# Patient Record
Sex: Female | Born: 1965 | Race: White | Hispanic: No | Marital: Married | Smoking: Never smoker
Health system: Southern US, Community
[De-identification: ages and names within clinical notes are randomized; demographics above are authoritative.]

---

## 2020-05-05 ENCOUNTER — Other Ambulatory Visit: Payer: Self-pay

## 2020-05-05 ENCOUNTER — Encounter (HOSPITAL_COMMUNITY): Payer: Self-pay | Admitting: Emergency Medicine

## 2020-05-05 ENCOUNTER — Inpatient Hospital Stay (HOSPITAL_COMMUNITY)
Admission: EM | Admit: 2020-05-05 | Discharge: 2020-05-11 | DRG: 758 | Disposition: A | Discharge status: Home / Self Care | Payer: 59 | Attending: Obstetrics and Gynecology | Admitting: Obstetrics and Gynecology

## 2020-05-05 DIAGNOSIS — R1032 Left lower quadrant pain: Secondary | ICD-10-CM | POA: Diagnosis not present

## 2020-05-05 DIAGNOSIS — N739 Female pelvic inflammatory disease, unspecified: Principal | ICD-10-CM

## 2020-05-05 DIAGNOSIS — R102 Pelvic and perineal pain: Secondary | ICD-10-CM

## 2020-05-05 DIAGNOSIS — N7093 Salpingitis and oophoritis, unspecified: Secondary | ICD-10-CM | POA: Diagnosis present

## 2020-05-05 DIAGNOSIS — Z20822 Contact with and (suspected) exposure to covid-19: Secondary | ICD-10-CM | POA: Diagnosis present

## 2020-05-05 DIAGNOSIS — K5792 Diverticulitis of intestine, part unspecified, without perforation or abscess without bleeding: Secondary | ICD-10-CM | POA: Diagnosis present

## 2020-05-05 DIAGNOSIS — K567 Ileus, unspecified: Secondary | ICD-10-CM | POA: Diagnosis present

## 2020-05-05 DIAGNOSIS — N838 Other noninflammatory disorders of ovary, fallopian tube and broad ligament: Secondary | ICD-10-CM | POA: Diagnosis present

## 2020-05-05 DIAGNOSIS — N839 Noninflammatory disorder of ovary, fallopian tube and broad ligament, unspecified: Secondary | ICD-10-CM | POA: Diagnosis present

## 2020-05-05 LAB — COMPREHENSIVE METABOLIC PANEL
ALT: 25 U/L (ref 0–44)
AST: 21 U/L (ref 15–41)
Albumin: 3.7 g/dL (ref 3.5–5.0)
Alkaline Phosphatase: 62 U/L (ref 38–126)
Anion gap: 10 (ref 5–15)
BUN: 8 mg/dL (ref 6–20)
CO2: 27 mmol/L (ref 22–32)
Calcium: 9.2 mg/dL (ref 8.9–10.3)
Chloride: 101 mmol/L (ref 98–111)
Creatinine, Ser: 0.99 mg/dL (ref 0.44–1.00)
GFR, Estimated: >60 mL/min (ref >60)
Glucose, Bld: 126 mg/dL — ABNORMAL HIGH (ref 70–99)
Potassium: 4.4 mmol/L (ref 3.5–5.1)
Sodium: 138 mmol/L (ref 135–145)
Total Bilirubin: 0.5 mg/dL (ref 0.3–1.2)
Total Protein: 6.8 g/dL (ref 6.5–8.1)

## 2020-05-05 LAB — I-STAT BETA HCG BLOOD, ED (MC, WL, AP ONLY): I-stat hCG, quantitative: <5 m[IU]/mL (ref <5)

## 2020-05-05 LAB — URINALYSIS, ROUTINE W REFLEX MICROSCOPIC
Bacteria, UA: NONE SEEN
Bilirubin Urine: NEGATIVE
Glucose, UA: NEGATIVE mg/dL
Ketones, ur: NEGATIVE mg/dL
Nitrite: NEGATIVE
Protein, ur: NEGATIVE mg/dL
Specific Gravity, Urine: 1.023 (ref 1.005–1.030)
pH: 5 (ref 5.0–8.0)

## 2020-05-05 LAB — LIPASE, BLOOD: Lipase: 33 U/L (ref 11–51)

## 2020-05-05 LAB — CBC
HCT: 40.5 % (ref 36.0–46.0)
Hemoglobin: 13.5 g/dL (ref 12.0–15.0)
MCH: 30.4 pg (ref 26.0–34.0)
MCHC: 33.3 g/dL (ref 30.0–36.0)
MCV: 91.2 fL (ref 80.0–100.0)
Platelets: 273 10*3/uL (ref 150–400)
RBC: 4.44 MIL/uL (ref 3.87–5.11)
RDW: 13.1 % (ref 11.5–15.5)
WBC: 17.9 10*3/uL — ABNORMAL HIGH (ref 4.0–10.5)
nRBC: 0 % (ref 0.0–0.2)

## 2020-05-05 MED ORDER — ACETAMINOPHEN 325 MG PO TABS
650.0000 mg | ORAL_TABLET | Freq: Once | ORAL | Status: AC
  Administered 2020-05-05: 650 mg via ORAL
  Filled 2020-05-05: qty 2

## 2020-05-05 NOTE — ED Triage Notes (Signed)
Pt reports left lower abdominal pain since last night. Pt went to urgent care and was given prescriptions and told to come to the ER if worse. Pt reports after taking medicines, vomited and pain worsened.

## 2020-05-06 ENCOUNTER — Emergency Department (HOSPITAL_COMMUNITY): Payer: 59

## 2020-05-06 ENCOUNTER — Encounter (HOSPITAL_COMMUNITY): Payer: Self-pay | Admitting: Family Medicine

## 2020-05-06 ENCOUNTER — Other Ambulatory Visit: Payer: Self-pay

## 2020-05-06 ENCOUNTER — Inpatient Hospital Stay (HOSPITAL_COMMUNITY): Payer: 59

## 2020-05-06 DIAGNOSIS — K5792 Diverticulitis of intestine, part unspecified, without perforation or abscess without bleeding: Secondary | ICD-10-CM | POA: Diagnosis present

## 2020-05-06 DIAGNOSIS — N739 Female pelvic inflammatory disease, unspecified: Secondary | ICD-10-CM | POA: Diagnosis not present

## 2020-05-06 DIAGNOSIS — Z20822 Contact with and (suspected) exposure to covid-19: Secondary | ICD-10-CM | POA: Diagnosis present

## 2020-05-06 DIAGNOSIS — N7093 Salpingitis and oophoritis, unspecified: Secondary | ICD-10-CM | POA: Diagnosis not present

## 2020-05-06 DIAGNOSIS — N838 Other noninflammatory disorders of ovary, fallopian tube and broad ligament: Secondary | ICD-10-CM | POA: Diagnosis not present

## 2020-05-06 DIAGNOSIS — N858 Other specified noninflammatory disorders of uterus: Secondary | ICD-10-CM | POA: Diagnosis not present

## 2020-05-06 DIAGNOSIS — K567 Ileus, unspecified: Secondary | ICD-10-CM | POA: Diagnosis present

## 2020-05-06 DIAGNOSIS — R1032 Left lower quadrant pain: Secondary | ICD-10-CM | POA: Diagnosis present

## 2020-05-06 DIAGNOSIS — N839 Noninflammatory disorder of ovary, fallopian tube and broad ligament, unspecified: Secondary | ICD-10-CM | POA: Diagnosis present

## 2020-05-06 LAB — CBC WITH DIFFERENTIAL/PLATELET
Abs Immature Granulocytes: 0.08 10*3/uL — ABNORMAL HIGH (ref 0.00–0.07)
Basophils Absolute: 0 10*3/uL (ref 0.0–0.1)
Basophils Relative: 0 %
Eosinophils Absolute: 0 10*3/uL (ref 0.0–0.5)
Eosinophils Relative: 0 %
HCT: 34.5 % — ABNORMAL LOW (ref 36.0–46.0)
Hemoglobin: 11.8 g/dL — ABNORMAL LOW (ref 12.0–15.0)
Immature Granulocytes: 1 %
Lymphocytes Relative: 10 %
Lymphs Abs: 1.6 10*3/uL (ref 0.7–4.0)
MCH: 30.6 pg (ref 26.0–34.0)
MCHC: 34.2 g/dL (ref 30.0–36.0)
MCV: 89.6 fL (ref 80.0–100.0)
Monocytes Absolute: 0.9 10*3/uL (ref 0.1–1.0)
Monocytes Relative: 6 %
Neutro Abs: 13.9 10*3/uL — ABNORMAL HIGH (ref 1.7–7.7)
Neutrophils Relative %: 83 %
Platelets: 220 10*3/uL (ref 150–400)
RBC: 3.85 MIL/uL — ABNORMAL LOW (ref 3.87–5.11)
RDW: 13.2 % (ref 11.5–15.5)
WBC: 16.5 10*3/uL — ABNORMAL HIGH (ref 4.0–10.5)
nRBC: 0 % (ref 0.0–0.2)

## 2020-05-06 LAB — COMPREHENSIVE METABOLIC PANEL
ALT: 21 U/L (ref 0–44)
AST: 17 U/L (ref 15–41)
Albumin: 3 g/dL — ABNORMAL LOW (ref 3.5–5.0)
Alkaline Phosphatase: 48 U/L (ref 38–126)
Anion gap: 8 (ref 5–15)
BUN: <5 mg/dL — ABNORMAL LOW (ref 6–20)
CO2: 26 mmol/L (ref 22–32)
Calcium: 8.5 mg/dL — ABNORMAL LOW (ref 8.9–10.3)
Chloride: 105 mmol/L (ref 98–111)
Creatinine, Ser: 0.76 mg/dL (ref 0.44–1.00)
GFR, Estimated: >60 mL/min (ref >60)
Glucose, Bld: 106 mg/dL — ABNORMAL HIGH (ref 70–99)
Potassium: 3.7 mmol/L (ref 3.5–5.1)
Sodium: 139 mmol/L (ref 135–145)
Total Bilirubin: 0.8 mg/dL (ref 0.3–1.2)
Total Protein: 5.7 g/dL — ABNORMAL LOW (ref 6.5–8.1)

## 2020-05-06 LAB — TYPE AND SCREEN
ABO/RH(D): A NEG
Antibody Screen: NEGATIVE

## 2020-05-06 LAB — RESP PANEL BY RT-PCR (FLU A&B, COVID) ARPGX2
Influenza A by PCR: NEGATIVE
Influenza B by PCR: NEGATIVE
SARS Coronavirus 2 by RT PCR: NEGATIVE

## 2020-05-06 LAB — LACTIC ACID, PLASMA
Lactic Acid, Venous: 1.2 mmol/L (ref 0.5–1.9)
Lactic Acid, Venous: 1 mmol/L (ref 0.5–1.9)
Lactic Acid, Venous: 1 mmol/L (ref 0.5–1.9)

## 2020-05-06 LAB — MAGNESIUM: Magnesium: 1.6 mg/dL — ABNORMAL LOW (ref 1.7–2.4)

## 2020-05-06 MED ORDER — ENOXAPARIN SODIUM 60 MG/0.6ML ~~LOC~~ SOLN: 60.0000 mg | SUBCUTANEOUS | Status: DC

## 2020-05-06 MED ORDER — LACTATED RINGERS IV BOLUS (SEPSIS)
1000.0000 mL | Freq: Once | INTRAVENOUS | Status: AC
  Administered 2020-05-06: 1000 mL via INTRAVENOUS

## 2020-05-06 MED ORDER — METRONIDAZOLE IN NACL 5-0.79 MG/ML-% IV SOLN
500.0000 mg | Freq: Once | INTRAVENOUS | Status: AC
  Administered 2020-05-06: 500 mg via INTRAVENOUS
  Filled 2020-05-06: qty 100

## 2020-05-06 MED ORDER — LACTATED RINGERS IV SOLN: INTRAVENOUS | Status: DC

## 2020-05-06 MED ORDER — MENTHOL 3 MG MT LOZG
1.0000 | LOZENGE | OROMUCOSAL | Status: DC | PRN
Start: 2020-05-06 — End: 2020-05-11

## 2020-05-06 MED ORDER — FENTANYL CITRATE (PF) 100 MCG/2ML IJ SOLN
100.0000 ug | Freq: Once | INTRAMUSCULAR | Status: AC
  Administered 2020-05-06: 100 ug via INTRAVENOUS
  Filled 2020-05-06: qty 2

## 2020-05-06 MED ORDER — MAGNESIUM SULFATE 2 GM/50ML IV SOLN
2.0000 g | Freq: Once | INTRAVENOUS | Status: AC
  Administered 2020-05-06: 2 g via INTRAVENOUS
  Filled 2020-05-06: qty 50

## 2020-05-06 MED ORDER — POLYETHYLENE GLYCOL 3350 17 G PO PACK: 17.0000 g | PACK | Freq: Every day | ORAL | Status: DC | PRN

## 2020-05-06 MED ORDER — IOHEXOL 9 MG/ML PO SOLN
ORAL | Status: AC
  Administered 2020-05-06: 500 mL
  Filled 2020-05-06: qty 500

## 2020-05-06 MED ORDER — SODIUM CHLORIDE 0.9 % IV SOLN
100.0000 mg | Freq: Two times a day (BID) | INTRAVENOUS | Status: DC
  Administered 2020-05-06 – 2020-05-09 (×7): 100 mg via INTRAVENOUS
  Filled 2020-05-06 (×8): qty 100

## 2020-05-06 MED ORDER — SODIUM CHLORIDE 0.9 % IV SOLN
2.0000 g | INTRAVENOUS | Status: DC
  Administered 2020-05-07 – 2020-05-09 (×3): 2 g via INTRAVENOUS
  Filled 2020-05-06: qty 20
  Filled 2020-05-06: qty 2
  Filled 2020-05-06 (×2): qty 20
  Filled 2020-05-06: qty 2

## 2020-05-06 MED ORDER — ONDANSETRON HCL 4 MG/2ML IJ SOLN
INTRAMUSCULAR | Status: AC
  Administered 2020-05-06: 4 mg via INTRAVENOUS
  Filled 2020-05-06: qty 2

## 2020-05-06 MED ORDER — ONDANSETRON HCL 4 MG PO TABS
4.0000 mg | ORAL_TABLET | Freq: Four times a day (QID) | ORAL | Status: DC | PRN
Start: 2020-05-06 — End: 2020-05-06

## 2020-05-06 MED ORDER — SODIUM CHLORIDE 0.9 % IV SOLN
2.0000 g | Freq: Once | INTRAVENOUS | Status: AC
  Administered 2020-05-06: 2 g via INTRAVENOUS
  Filled 2020-05-06: qty 20

## 2020-05-06 MED ORDER — GADOBUTROL 1 MMOL/ML IV SOLN
10.0000 mL | Freq: Once | INTRAVENOUS | Status: AC | PRN
  Administered 2020-05-06: 10 mL via INTRAVENOUS

## 2020-05-06 MED ORDER — GUAIFENESIN 100 MG/5ML PO SOLN
15.0000 mL | ORAL | Status: DC | PRN
Start: 2020-05-06 — End: 2020-05-11

## 2020-05-06 MED ORDER — MORPHINE SULFATE (PF) 2 MG/ML IV SOLN: 1.0000 mg | INTRAVENOUS | Status: DC | PRN

## 2020-05-06 MED ORDER — TRAMADOL HCL 50 MG PO TABS
50.0000 mg | ORAL_TABLET | Freq: Four times a day (QID) | ORAL | Status: DC | PRN
Start: 2020-05-06 — End: 2020-05-06

## 2020-05-06 MED ORDER — METRONIDAZOLE IN NACL 5-0.79 MG/ML-% IV SOLN
500.0000 mg | Freq: Two times a day (BID) | INTRAVENOUS | Status: DC
  Administered 2020-05-06 – 2020-05-09 (×7): 500 mg via INTRAVENOUS
  Filled 2020-05-06 (×8): qty 100

## 2020-05-06 MED ORDER — ALUM & MAG HYDROXIDE-SIMETH 200-200-20 MG/5ML PO SUSP: 30.0000 mL | ORAL | Status: DC | PRN

## 2020-05-06 MED ORDER — ONDANSETRON HCL 4 MG/2ML IJ SOLN: 4.0000 mg | Freq: Four times a day (QID) | INTRAMUSCULAR | Status: DC | PRN

## 2020-05-06 MED ORDER — DOXYCYCLINE HYCLATE 100 MG PO TABS: 100.0000 mg | ORAL_TABLET | Freq: Two times a day (BID) | ORAL | Status: DC

## 2020-05-06 MED ORDER — ACETAMINOPHEN 325 MG PO TABS
650.0000 mg | ORAL_TABLET | ORAL | Status: DC | PRN
  Administered 2020-05-06 – 2020-05-10 (×6): 650 mg via ORAL
  Filled 2020-05-06 (×6): qty 2

## 2020-05-06 MED ORDER — ONDANSETRON HCL 4 MG/2ML IJ SOLN
4.0000 mg | Freq: Once | INTRAMUSCULAR | Status: AC
  Administered 2020-05-06: 4 mg via INTRAVENOUS
  Filled 2020-05-06: qty 2

## 2020-05-06 MED ORDER — PROCHLORPERAZINE EDISYLATE 10 MG/2ML IJ SOLN
10.0000 mg | Freq: Four times a day (QID) | INTRAMUSCULAR | Status: DC | PRN
  Administered 2020-05-07: 10 mg via INTRAVENOUS
  Filled 2020-05-06 (×4): qty 2

## 2020-05-06 MED ORDER — ENOXAPARIN SODIUM 40 MG/0.4ML ~~LOC~~ SOLN: 40.0000 mg | SUBCUTANEOUS | Status: DC

## 2020-05-06 MED ORDER — METRONIDAZOLE IN NACL 5-0.79 MG/ML-% IV SOLN
500.0000 mg | Freq: Four times a day (QID) | INTRAVENOUS | Status: DC
  Filled 2020-05-06 (×2): qty 100

## 2020-05-06 MED ORDER — ONDANSETRON HCL 4 MG/2ML IJ SOLN: 4.0000 mg | Freq: Once | INTRAMUSCULAR | Status: AC

## 2020-05-06 MED ORDER — PANTOPRAZOLE SODIUM 40 MG IV SOLR
40.0000 mg | INTRAVENOUS | Status: DC
  Administered 2020-05-06 – 2020-05-09 (×4): 40 mg via INTRAVENOUS
  Filled 2020-05-06 (×4): qty 40

## 2020-05-06 NOTE — ED Provider Notes (Signed)
[facility] EMERGENCY DEPARTMENT Provider Note   CSN: [id number] Arrival date & time: 05/05/20  2023     History Chief Complaint  Patient presents with  . Abdominal Pain    Anna Stephenson is a 55 y.o. female.  The history is provided by the patient.  Abdominal Pain Pain location:  LLQ Pain quality: aching   Pain radiates to:  Does not radiate Pain severity:  Moderate Onset quality:  Gradual Duration:  1 day Timing:  Constant Progression:  Worsening Chronicity:  New Relieved by:  Nothing Worsened by:  Movement and palpation Associated symptoms: chills, fever and vomiting   Associated symptoms: no cough, no diarrhea, no dysuria and no hematochezia   Risk factors: has not had multiple surgeries    Patient reports over the past day she has had left lower quadrant abdominal pain.  She went to an urgent care and was given Cipro and Flagyl for presumed diverticulitis.  However her pain is significantly worsened.  She also reports fever and vomiting.  She denies any past surgical history. No previous history of diverticulitis     PMH-obesity Soc hx - lives in [place] History   No obstetric history on file.     No family history on file.  Social History   Tobacco Use  . Smoking status: Never Smoker  Substance Use Topics  . Alcohol use: Yes  . Drug use: Never    Home Medications Prior to Admission medications   Not on File    Allergies    Shellfish allergy and Nsaids  Review of Systems   Review of Systems  Constitutional: Positive for chills and fever.  Respiratory: Negative for cough.   Gastrointestinal: Positive for abdominal pain and vomiting. Negative for diarrhea and hematochezia.  Genitourinary: Negative for dysuria.  All other systems reviewed and are negative.   Physical Exam Updated Vital Signs BP 111/64   Pulse 84   Temp (!) 101.5 F (38.6 C) (Oral)   Resp 18   LMP 04/21/2020   SpO2 96%   Physical  Exam CONSTITUTIONAL: Well developed/well nourished, uncomfortable HEAD: Normocephalic/atraumatic EYES: EOMI/PERRL ENMT: Mucous membranes moist NECK: supple no meningeal signs SPINE/BACK:entire spine nontender CV: S1/S2 noted, no murmurs/rubs/gallops noted LUNGS: Lungs are clear to auscultation bilaterally, no apparent distress ABDOMEN: soft, moderate LLQ tenderness, no rebound or guarding, bowel sounds noted throughout abdomen GU:no cva tenderness NEURO: Pt is awake/alert/appropriate, moves all extremitiesx4.  No facial droop.   EXTREMITIES: pulses normal/equal, full ROM SKIN: warm, color normal PSYCH: no abnormalities of mood noted, alert and oriented to situation  ED Results / Procedures / Treatments   Labs (all labs ordered are listed, but only abnormal results are displayed) Labs Reviewed  COMPREHENSIVE METABOLIC PANEL - Abnormal; Notable for the following components:      Result Value   Glucose, Bld 126 (*)    All other components within normal limits  CBC - Abnormal; Notable for the following components:   WBC 17.9 (*)    All other components within normal limits  URINALYSIS, ROUTINE W REFLEX MICROSCOPIC - Abnormal; Notable for the following components:   Hgb urine dipstick SMALL (*)    Leukocytes,Ua SMALL (*)    All other components within normal limits  RESP PANEL BY RT-PCR (FLU A&B, COVID) ARPGX2  CULTURE, BLOOD (ROUTINE X 2)  CULTURE, BLOOD (ROUTINE X 2)  LIPASE, BLOOD  LACTIC ACID, PLASMA  LACTIC ACID, PLASMA  I-STAT BETA HCG BLOOD, ED ([facility], [facility], [facility]  ONLY)    EKG None  Radiology CT ABDOMEN PELVIS WO CONTRAST  Addendum Date: 05/06/2020   ADDENDUM REPORT: 05/06/2020 04:02 ADDENDUM: Diverticulosis but no definite acute diverticulitis. These results were called by telephone at the time of interpretation on 05/06/2020 at 4:01 am to provider Zadie Rhine , who verbally acknowledged these results. Electronically Signed   By: Tish Frederickson M.D.   On: 05/06/2020 04:02    Result Date: 05/06/2020 CLINICAL DATA:  Diverticulitis is suspected.  Abdominal pain. EXAM: CT ABDOMEN AND PELVIS WITHOUT CONTRAST TECHNIQUE: Multidetector CT imaging of the abdomen and pelvis was performed following the standard protocol without IV contrast. COMPARISON:  None. FINDINGS: Lower chest: Bibasilar atelectasis.  No acute abnormality. Hepatobiliary: No focal liver abnormality. No gallstones, gallbladder wall thickening, or pericholecystic fluid. No biliary dilatation. Pancreas: No focal lesion. Normal pancreatic contour. No surrounding inflammatory changes. No main pancreatic ductal dilatation. Spleen: Normal in size without focal abnormality. Adrenals/Urinary Tract: No adrenal nodule bilaterally. No nephrolithiasis, no hydronephrosis, and no contour-deforming renal mass. No ureterolithiasis or hydroureter. The urinary bladder is unremarkable. Stomach/Bowel: Stomach is within normal limits. No evidence of small bowel wall thickening or dilatation. Diffuse colonic diverticulosis. Appendix appears normal. Vascular/Lymphatic: No abdominal aorta or iliac aneurysm. No atherosclerotic plaque of the aorta and its branches. No abdominal, pelvic, or inguinal lymphadenopathy. Reproductive: The uterus and right adnexa are unremarkable. There is a heterogeneous 4.4 x 3 cm left adnexal lesion. Associated fat stranding of the surrounding mesentery is noted. Other: No intraperitoneal free fluid. No intraperitoneal free gas. No organized fluid collection. Musculoskeletal: No abdominal wall hernia or abnormality. No suspicious lytic or blastic osseous lesions. No acute displaced fracture. IMPRESSION: Heterogeneous 4.4 x 3 cm left adnexal lesion. Ovarian torsion not excluded. Recommend pelvic ultrasound for further evaluation. Electronically Signed: By: Tish Frederickson M.D. On: 05/06/2020 02:23   US PELVIS (TRANSABDOMINAL ONLY)  Addendum Date: 05/06/2020   ADDENDUM REPORT: 05/06/2020 04:01 ADDENDUM: Unclear why the  ovaries are asymmetric. Poor evaluation of the ovaries on the transabdominal ultrasound. These results were called by telephone at the time of interpretation on 05/06/2020 at 4:00 am to provider Zadie Rhine , who verbally acknowledged these results. Electronically Signed   By: Tish Frederickson M.D.   On: 05/06/2020 04:01   Result Date: 05/06/2020 CLINICAL DATA:  Asymmetric ovaries and inflammatory changes in the pelvis, ovarian torsion not excluded. EXAM: TRANSABDOMINAL ULTRASOUND OF PELVIS DOPPLER ULTRASOUND OF OVARIES TECHNIQUE: Transabdominal ultrasound examination of the pelvis was performed including evaluation of the uterus, ovaries, adnexal regions, and pelvic cul-de-sac. Color and duplex Doppler ultrasound was utilized to evaluate blood flow to the ovaries. COMPARISON:  CT abdomen pelvis 05/06/2020 FINDINGS: Uterus Measurements: 9.4 x 2.6 x 4.9 cm = volume: 87 mL. No fibroids or other mass visualized. Endometrium Thickness: 5 mm.  No focal abnormality visualized. Right ovary Measurements: 2.5 x 0.8 x 1.6 cm = volume: 1.7 mL. Grossly unremarkable appearance adnexal mass. Limited evaluation on transabdominal only ultrasound. Left ovary Measurements: 3.5 x 2.1 x 2.9 cm = volume: 10.8 mL. Grossly unremarkable appearance adnexal mass. Limited evaluation on transabdominal only ultrasound. Pulsed Doppler evaluation demonstrates normal low-resistance arterial and venous waveforms in both ovaries. Other: No free pelvic fluid. IMPRESSION: Asymmetrically enlarged left ovary compared to the right with limited evaluation on this transabdominal pelvic ultrasound. Vascular flow noted to bilateral ovaries with ovarian torsion not excluded as it can be intermittent. Electronically Signed: By: Tish Frederickson M.D. On: 05/06/2020 03:39   US PELVIC DOPPLER (  TORSION R/O OR MASS ARTERIAL FLOW)  Addendum Date: 05/06/2020   ADDENDUM REPORT: 05/06/2020 04:01 ADDENDUM: Unclear why the ovaries are asymmetric. Poor evaluation  of the ovaries on the transabdominal ultrasound. These results were called by telephone at the time of interpretation on 05/06/2020 at 4:00 am to provider Zadie Rhine , who verbally acknowledged these results. Electronically Signed   By: Tish Frederickson M.D.   On: 05/06/2020 04:01   Result Date: 05/06/2020 CLINICAL DATA:  Asymmetric ovaries and inflammatory changes in the pelvis, ovarian torsion not excluded. EXAM: TRANSABDOMINAL ULTRASOUND OF PELVIS DOPPLER ULTRASOUND OF OVARIES TECHNIQUE: Transabdominal ultrasound examination of the pelvis was performed including evaluation of the uterus, ovaries, adnexal regions, and pelvic cul-de-sac. Color and duplex Doppler ultrasound was utilized to evaluate blood flow to the ovaries. COMPARISON:  CT abdomen pelvis 05/06/2020 FINDINGS: Uterus Measurements: 9.4 x 2.6 x 4.9 cm = volume: 87 mL. No fibroids or other mass visualized. Endometrium Thickness: 5 mm.  No focal abnormality visualized. Right ovary Measurements: 2.5 x 0.8 x 1.6 cm = volume: 1.7 mL. Grossly unremarkable appearance adnexal mass. Limited evaluation on transabdominal only ultrasound. Left ovary Measurements: 3.5 x 2.1 x 2.9 cm = volume: 10.8 mL. Grossly unremarkable appearance adnexal mass. Limited evaluation on transabdominal only ultrasound. Pulsed Doppler evaluation demonstrates normal low-resistance arterial and venous waveforms in both ovaries. Other: No free pelvic fluid. IMPRESSION: Asymmetrically enlarged left ovary compared to the right with limited evaluation on this transabdominal pelvic ultrasound. Vascular flow noted to bilateral ovaries with ovarian torsion not excluded as it can be intermittent. Electronically Signed: By: Tish Frederickson M.D. On: 05/06/2020 03:39    Procedures .Critical Care Performed by: Zadie Rhine, MD Authorized by: Zadie Rhine, MD   Critical care provider statement:    Critical care time (minutes):  60   Critical care start time:  05/06/2020 1:25  AM   Critical care end time:  05/06/2020 2:25 AM   Critical care time was exclusive of:  Separately billable procedures and treating other patients   Critical care was necessary to treat or prevent imminent or life-threatening deterioration of the following conditions:  Sepsis and shock   Critical care was time spent personally by me on the following activities:  Ordering and review of radiographic studies, ordering and review of laboratory studies, pulse oximetry, re-evaluation of patient's condition, examination of patient, development of treatment plan with patient or surrogate, ordering and performing treatments and interventions and discussions with consultants   I assumed direction of critical care for this patient from another provider in my specialty: no     Care discussed with: admitting provider       Medications Ordered in ED Medications  lactated ringers infusion ( Intravenous New Bag/Given 05/06/20 0215)  acetaminophen (TYLENOL) tablet 650 mg (650 mg Oral Given 05/05/20 2040)  lactated ringers bolus 1,000 mL (0 mLs Intravenous Stopped 05/06/20 0111)    And  lactated ringers bolus 1,000 mL (0 mLs Intravenous Stopped 05/06/20 0200)    And  lactated ringers bolus 1,000 mL (0 mLs Intravenous Stopped 05/06/20 0216)  cefTRIAXone (ROCEPHIN) 2 g in sodium chloride 0.9 % 100 mL IVPB (0 g Intravenous Stopped 05/06/20 0110)  metroNIDAZOLE (FLAGYL) IVPB 500 mg (0 mg Intravenous Stopped 05/06/20 0145)  ondansetron (ZOFRAN) injection 4 mg (4 mg Intravenous Given 05/06/20 0039)  fentaNYL (SUBLIMAZE) injection 100 mcg (100 mcg Intravenous Given 05/06/20 0040)  iohexol (OMNIPAQUE) 9 MG/ML oral solution (500 mLs  Contrast Given 05/06/20 0020)  ondansetron (ZOFRAN) injection  4 mg (4 mg Intravenous Given 05/06/20 0216)    ED Course  I have reviewed the triage vital signs and the nursing notes.  Pertinent labs & imaging results that were available during my care of the patient were reviewed by me and  considered in my medical decision making (see chart for details).    MDM Rules/Calculators/A&P                         1:26 AM Patient had brief drop in blood pressure after medicines, now responding to IV fluids 3:23 AM CT scan negative for pneumoperitoneum or obvious diverticulitis.  However she does have a large left adnexal lesion Patient denies any known issues with ovarian cyst.  Patient had significant vomiting after drinking oral contrast, but this is improved.  She has also been mildly hypotensive and has been given IV fluids.  Fortunately her lactic acid is negative Pelvic ultrasound is pending at this time 4:02 AM No obvious signs of torsion on US She does have enlarged ovary Pt refused TV ultrasound D/w radiology dr Tessie Fassnaveau - no obvious signs of diverticulitis or other GI cause Will consult GYN 4:38 AM D/w Dr. Shawnie PonsPratt for admission to gynecology. Patient reports feeling improved   This patient presents to the ED for concern of abdominal pain, this involves an extensive number of treatment options, and is a complaint that carries with it a high risk of complications and morbidity.  The differential diagnosis includes diverticulitis, bowel perforation, UTI, kidney stone, bowel obstruction, appendicitis   Lab Tests:   I Ordered, reviewed, and interpreted labs, which included lactic acid, electrolytes, lipase, complete blood count, urinalysis  Medicines ordered:   I ordered medication fentanyl for pain, antibiotics for presumed infection  Imaging Studies ordered:   I ordered imaging studies which included CT abdomen pelvis without IV contrast at patient request        I independently visualized and interpreted imaging which showed large adnexal mass  Additional history obtained:    Previous records obtained and reviewed   Consultations Obtained:   I consulted GYN and discussed lab and imaging findings  Reevaluation:  After the interventions stated above, I  reevaluated the patient and found patient is improved  Critical Interventions:  . IV fluids and antibiotics  Final Clinical Impression(s) / ED Diagnoses Final diagnoses:  Pelvic pain  Ovarian mass, left    Rx / DC Orders ED Discharge Orders    None       Zadie RhineWickline, Devarius Nelles, MD 05/06/20 [phone]

## 2020-05-06 NOTE — Progress Notes (Signed)
[facility] Sepsis initiated @ 0004, [facility] following.

## 2020-05-06 NOTE — H&P (Signed)
Anna Stephenson is an 55 y.o. No obstetric history on file. female.   Chief Complaint: Left lower quadrant pain HPI: Patient is traveling here for work from [place].  She is a G0.  She reports subacute onset of left lower quadrant pain approximately 2 evenings ago.  She had low-grade temp at this time.  She went to the urgent care on yesterday was treated for presumptive diverticulitis.  She was started on Cipro and Flagyl.  She developed worsening pain as well as ongoing nausea and vomiting which may have been related to the Flagyl.  She came into the ED for evaluation.  In the ED she was noted to have an elevated temp of 101.5, elevated WBC at 17.9.  She had a negative lactate.  She was started on Rocephin and Flagyl in the ER.  CT scan revealed an enlarged left ovary with some associated fat stranding.  They were unable to rule out torsion.  Pelvic ultrasound also revealed an enlarged ovary with normal flow.  The patient is still menstruating and her last menstrual period was 2 to 3 weeks ago.  We are asked to admit for presumed PID with left TOA  History reviewed. No pertinent past medical history.  History reviewed. No pertinent surgical history.  Family History  Problem Relation Age of Onset  . Diverticulitis Father    Social History:  reports that she has never smoked. She does not have any smokeless tobacco history on file. She reports current alcohol use. She reports that she does not use drugs.  Allergies:  Allergies  Allergen Reactions  . Shellfish Allergy Anaphylaxis  . Nsaids Swelling    (Not in a hospital admission)   Pertinent items are noted in HPI.  Blood pressure 105/71, pulse 79, temperature 99.2 F (37.3 C), resp. rate 18, last menstrual period 04/21/2020, SpO2 95 %. General appearance: alert, cooperative and appears stated age Head: Normocephalic, without obvious abnormality, atraumatic Neck: supple, symmetrical, trachea midline Lungs: Normal effort Heart:  regular rate and rhythm Abdomen: Soft, diffusely tender on the left from the upper abdomen to the low.  Positive rebound. Extremities: extremities normal, atraumatic, no cyanosis or edema Skin: Skin color, texture, turgor normal. No rashes or lesions Neurologic: Grossly normal   Results for orders placed or performed during the hospital encounter of 05/05/20 (from the past 24 hour(s))  Lipase, blood     Status: None   Collection Time: 05/05/20  8:41 PM  Result Value Ref Range   Lipase 33 11 - 51 U/L  Comprehensive metabolic panel     Status: Abnormal   Collection Time: 05/05/20  8:41 PM  Result Value Ref Range   Sodium 138 135 - 145 mmol/L   Potassium 4.4 3.5 - 5.1 mmol/L   Chloride 101 98 - 111 mmol/L   CO2 27 22 - 32 mmol/L   Glucose, Bld 126 (H) 70 - 99 mg/dL   BUN 8 6 - 20 mg/dL   Creatinine, Ser 7.06 0.44 - 1.00 mg/dL   Calcium 9.2 8.9 - 23.7 mg/dL   Total Protein 6.8 6.5 - 8.1 g/dL   Albumin 3.7 3.5 - 5.0 g/dL   AST 21 15 - 41 U/L   ALT 25 0 - 44 U/L   Alkaline Phosphatase 62 38 - 126 U/L   Total Bilirubin 0.5 0.3 - 1.2 mg/dL   GFR, Estimated >62 >83 mL/min   Anion gap 10 5 - 15  CBC     Status: Abnormal  Collection Time: 05/05/20  8:41 PM  Result Value Ref Range   WBC 17.9 (H) 4.0 - 10.5 K/uL   RBC 4.44 3.87 - 5.11 MIL/uL   Hemoglobin 13.5 12.0 - 15.0 g/dL   HCT 41.3 24.4 - 01.0 %   MCV 91.2 80.0 - 100.0 fL   MCH 30.4 26.0 - 34.0 pg   MCHC 33.3 30.0 - 36.0 g/dL   RDW 27.2 53.6 - 64.4 %   Platelets 273 150 - 400 K/uL   nRBC 0.0 0.0 - 0.2 %  I-Stat beta hCG blood, ED     Status: None   Collection Time: 05/05/20  9:34 PM  Result Value Ref Range   I-stat hCG, quantitative <5.0 <5 mIU/mL   Comment 3          Urinalysis, Routine w reflex microscopic Urine, Clean Catch     Status: Abnormal   Collection Time: 05/05/20 10:46 PM  Result Value Ref Range   Color, Urine YELLOW YELLOW   APPearance CLEAR CLEAR   Specific Gravity, Urine 1.023 1.005 - 1.030   pH 5.0 5.0  - 8.0   Glucose, UA NEGATIVE NEGATIVE mg/dL   Hgb urine dipstick SMALL (A) NEGATIVE   Bilirubin Urine NEGATIVE NEGATIVE   Ketones, ur NEGATIVE NEGATIVE mg/dL   Protein, ur NEGATIVE NEGATIVE mg/dL   Nitrite NEGATIVE NEGATIVE   Leukocytes,Ua SMALL (A) NEGATIVE   RBC / HPF 0-5 0 - 5 RBC/hpf   WBC, UA 0-5 0 - 5 WBC/hpf   Bacteria, UA NONE SEEN NONE SEEN   Squamous Epithelial / LPF 0-5 0 - 5   Mucus PRESENT    Hyaline Casts, UA PRESENT   Lactic acid, plasma     Status: None   Collection Time: 05/06/20 12:38 AM  Result Value Ref Range   Lactic Acid, Venous 1.2 0.5 - 1.9 mmol/L  Resp Panel by RT-PCR (Flu A&B, Covid) Nasopharyngeal Swab     Status: None   Collection Time: 05/06/20 12:38 AM   Specimen: Nasopharyngeal Swab; Nasopharyngeal(NP) swabs in vial transport medium  Result Value Ref Range   SARS Coronavirus 2 by RT PCR NEGATIVE NEGATIVE   Influenza A by PCR NEGATIVE NEGATIVE   Influenza B by PCR NEGATIVE NEGATIVE     Assessment/Plan Active Problems:   Ovarian mass   Pelvic infection in female  Presumed PID with TOA.  Suspect ovarian torsion is less likely as patient has much higher temp and WBC as is normally found in that disease process.  Additionally usually this sudden onset of pain related to torsion which this does not sound like.  Will check MRI to rule out torsion and put that to rest.  Additionally this certainly could be early diverticulitis as opposed to PID which usually comes right around menses.  However, treatment would be the same.  MRI may further delineate this as well.  Will admit to the hospital for IV antibiotics and parenteral antiemetics and pain control. Begin Rocephin, Doxy, Flagyl which will treat either diverticulitis or PID. Patient's plan was to return to [place] on the flight in the morning.  Suspect this will need to be delayed as she will primary care 48 hours or so. Will consult general surgery if needed.    Reva Bores 05/06/2020, 5:07  AM

## 2020-05-06 NOTE — Progress Notes (Signed)
GYN Update Note  CMP Latest Ref Rng & Units 05/06/2020 05/05/2020  Glucose 70 - 99 mg/dL 408(X) 448(J)  BUN 6 - 20 mg/dL <8(H) 8  Creatinine 6.31 - 1.00 mg/dL 4.97 0.26  Sodium 378 - 145 mmol/L 139 138  Potassium 3.5 - 5.1 mmol/L 3.7 4.4  Chloride 98 - 111 mmol/L 105 101  CO2 22 - 32 mmol/L 26 27  Calcium 8.9 - 10.3 mg/dL 5.8(I) 9.2  Total Protein 6.5 - 8.1 g/dL 5.0(Y) 6.8  Total Bilirubin 0.3 - 1.2 mg/dL 0.8 0.5  Alkaline Phos 38 - 126 U/L 48 62  AST 15 - 41 U/L 17 21  ALT 0 - 44 U/L 21 25   Mg 1.6  CA 125 pending  Mg 2gm IV given this morning. S/s stable. She did have a small loose BM when she had some sudden onset cramping.  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 Continue with NPO for now and reassess in the AM  Cornelia Copa MD Attending Center for [facility] (Faculty Practice) 05/06/2020 Time: [phone]

## 2020-05-06 NOTE — ED Notes (Signed)
Patient transported to Ultrasound 

## 2020-05-06 NOTE — Progress Notes (Addendum)
Gynecology Progress Note  Admission Date: 05/05/2020 Current Date: 05/06/2020 8:50 AM  Anna Stephenson is a 55 y.o. G0 HD#1 (LMP: two weeks ago) admitted for abdominal pain    History complicated by: Patient Active Problem List   Diagnosis Date Noted  . Ovarian mass 05/06/2020  . Pelvic infection in female 05/06/2020    ROS and patient/family/surgical history, located on admission H&P note dated 05/05/2020, have been reviewed, and there are no changes except as noted below Yesterday/Overnight Events:  n/a  Subjective:  Pain stable. No VB, discharge. She had a loose BM this morning (no blood). She had nausea with the abx but no vomiting.   Objective:    Patient Vitals for the past 24 hrs:  BP Temp Temp src Pulse Resp SpO2 Height Weight  05/06/20 0738 (!) 99/56 98.5 F (36.9 C) Oral 92 18 98 % -- --  05/06/20 0521 -- -- -- -- -- -- 5' (1.524 m) 102.9 kg  05/06/20 0515 (!) 104/54 98.1 F (36.7 C) Oral 72 18 98 % -- --  05/06/20 0453 105/71 99.2 F (37.3 C) -- 79 18 95 % -- --  05/06/20 0245 98/70 -- -- 85 19 94 % -- --  05/06/20 0230 93/64 -- -- 89 18 94 % -- --  05/06/20 0220 (!) 90/56 -- -- 83 20 96 % -- --  05/06/20 0130 90/60 -- -- 83 18 95 % -- --  05/06/20 0115 94/62 -- -- 87 20 98 % -- --  05/06/20 0100 (!) 95/59 -- -- 91 18 94 % -- --  05/06/20 0045 -- -- -- 85 12 98 % -- --  05/06/20 0015 91/67 -- -- 85 15 96 % -- --  05/06/20 0000 103/65 -- -- 88 18 97 % -- --  05/05/20 2350 111/64 -- -- 84 18 96 % -- --  05/05/20 2032 102/66 (!) 101.5 F (38.6 C) Oral (!) 102 16 98 % -- --    Physical exam: General appearance: alert, cooperative and appears stated age Abdomen: rare BS, mild to moderately distended, ttp in the llq, no peritoneal s/s Lungs: clear to auscultation bilaterally Heart: S1, S2 normal, no murmur, rub or gallop, regular rate and rhythm Extremities: no c/c/e Skin: warm and dry Psych: appropriate Neurologic: Grossly normal  Medications Current  Facility-Administered Medications  Medication Dose Route Frequency Provider Last Rate Last Admin  . acetaminophen (TYLENOL) tablet 650 mg  650 mg Oral Q4H PRN Reva Bores, MD      . alum & mag hydroxide-simeth (MAALOX/MYLANTA) 200-200-20 MG/5ML suspension 30 mL  30 mL Oral Q4H PRN Reva Bores, MD      . Melene Muller ON 05/07/2020] cefTRIAXone (ROCEPHIN) 2 g in sodium chloride 0.9 % 100 mL IVPB  2 g Intravenous Q24H Reva Bores, MD      . doxycycline (VIBRA-TABS) tablet 100 mg  100 mg Oral Q12H Reva Bores, MD      . enoxaparin (LOVENOX) injection 60 mg  60 mg Subcutaneous Q24H Reva Bores, MD      . guaiFENesin (ROBITUSSIN) 100 MG/5ML solution 300 mg  15 mL Oral Q4H PRN Reva Bores, MD      . lactated ringers infusion   Intravenous Continuous Reva Bores, MD 150 mL/hr at 05/06/20 0743 New Bag at 05/06/20 0743  . menthol-cetylpyridinium (CEPACOL) lozenge 3 mg  1 lozenge Oral Q2H PRN Reva Bores, MD      . metroNIDAZOLE (FLAGYL) IVPB 500 mg  500 mg Intravenous Q6H Reva Bores, MD      . morphine 2 MG/ML injection 1-2 mg  1-2 mg Intravenous Q3H PRN Reva Bores, MD      . ondansetron [facility]) tablet 4 mg  4 mg Oral Q6H PRN Reva Bores, MD       Or  . ondansetron (ZOFRAN) injection 4 mg  4 mg Intravenous Q6H PRN Reva Bores, MD      . polyethylene glycol (MIRALAX / GLYCOLAX) packet 17 g  17 g Oral Daily PRN Reva Bores, MD      . traMADol Janean Sark) tablet 50 mg  50 mg Oral Q6H PRN Reva Bores, MD          Labs  Recent Labs  Lab 05/05/20 2041 05/06/20 0529  WBC 17.9* 16.5*  HGB 13.5 11.8*  HCT 40.5 34.5*  PLT 273 220    Recent Labs  Lab 05/05/20 2041  NA 138  K 4.4  CL 101  CO2 27  BUN 8  CREATININE 0.99  CALCIUM 9.2  PROT 6.8  BILITOT 0.5  ALKPHOS 62  ALT 25  AST 21  GLUCOSE 126*    Radiology Narrative & Impression  CLINICAL DATA:  Left lower quadrant abdominal pain. Evaluate for PID versus diverticulitis.  EXAM: MRI PELVIS WITHOUT  AND WITH CONTRAST  TECHNIQUE: Multiplanar multisequence MR imaging of the pelvis was performed both before and after administration of intravenous contrast.  CONTRAST:  39mL GADAVIST GADOBUTROL 1 MMOL/ML IV SOLN  COMPARISON:  Abdominopelvic CT and pelvic ultrasound 05/06/2020.  FINDINGS: Urinary Tract: The visualized distal ureters and bladder appear unremarkable.  Bowel: Increased mild diffuse small bowel distension within the lower abdomen and false pelvis compared with the earlier CT, most consistent with an ileus. Diffuse diverticular changes are again noted throughout the descending and sigmoid colon. There is no colonic wall thickening, distention or suspicious enhancement to suggest diverticulitis.  Vascular/Lymphatic: No enlarged pelvic lymph nodes identified. No significant vascular findings. The pelvic veins appear patent.  Reproductive: Several nabothian cysts are noted. The uterus otherwise appears normal. As noted on previous studies, there is a complex left ovarian mass which measures 4.2 x 2.6 x 5.2 cm. This demonstrates heterogeneous T2 signal with a probable fluid-fluid level. Following contrast, there is heterogeneous wall and septal enhancement within this adnexal mass, and mild nodularity is present inferiorly. There are diffuse inflammatory changes and ill-defined fluid within the surrounding fat. There is no drainable fluid collection. The right ovary appears unremarkable.  Other: Physiologic amount of free pelvic ascites. No focal fluid collection or free air.  Musculoskeletal: No acute or worrisome osseous findings. No abnormal osseous or articular enhancement identified.  IMPRESSION: 1. Complex left ovarian mass with surrounding inflammatory changes and ill-defined fluid. Findings remain nonspecific, but in conjunction with the patient's symptoms, leukocytosis and prior imaging, findings are most consistent with pelvic  inflammatory disease/tubo-ovarian abscess. Less likely considerations would include a complex/hemorrhagic ovarian cyst or neoplasm with intermittent torsion accounting for the surrounding inflammatory changes. Follow-up pelvic ultrasound following appropriate therapy recommended. 2. Increased mild diffuse small bowel distension within the lower abdomen and false pelvis, most consistent with an ileus. 3. Diffuse diverticular changes throughout the descending and sigmoid colon. No evidence of diverticulitis.   Electronically Signed   By: Carey Bullocks M.D.   On: 05/06/2020 08:12     Assessment & Plan:  Pt stable *GYN: d/w IR and they believe they do have a window for  drainage. I d/w pt that I recommend holding off on IR drainage since she is clinically improving and at least until her CA125 comes back, although my suspicion for malignancy is extremely low. She would also like to hold off on IR drainage. Continue the rocephin, doxy and flagyl *Pain: IV PRNs *FEN/GI:  NPO for now given ileus on imaging which is likely due to inflammation. PPI. MIVF *PPx: SCDs. Hold off on lovenox until certain patient will not need a procedure *Dispo: anticipate 3-5 day long admission.   Code Status: Full Code  Total time taking care of the patient was 20 minutes, with greater than 50% of the time spent in face to face interaction with the patient.  Cornelia Copa MD Attending Center for [facility] GYN Consult Phone: [phone] (M-F, 0800-1700) & [phone] (Off hours, weekends, holidays)

## 2020-05-07 DIAGNOSIS — N858 Other specified noninflammatory disorders of uterus: Secondary | ICD-10-CM

## 2020-05-07 LAB — BASIC METABOLIC PANEL
Anion gap: 9 (ref 5–15)
BUN: <5 mg/dL — ABNORMAL LOW (ref 6–20)
CO2: 22 mmol/L (ref 22–32)
Calcium: 8 mg/dL — ABNORMAL LOW (ref 8.9–10.3)
Chloride: 104 mmol/L (ref 98–111)
Creatinine, Ser: 0.74 mg/dL (ref 0.44–1.00)
GFR, Estimated: >60 mL/min (ref >60)
Glucose, Bld: 88 mg/dL (ref 70–99)
Potassium: 3.8 mmol/L (ref 3.5–5.1)
Sodium: 135 mmol/L (ref 135–145)

## 2020-05-07 LAB — CBC WITH DIFFERENTIAL/PLATELET
Abs Immature Granulocytes: 0.06 10*3/uL (ref 0.00–0.07)
Basophils Absolute: 0 10*3/uL (ref 0.0–0.1)
Basophils Relative: 0 %
Eosinophils Absolute: 0 10*3/uL (ref 0.0–0.5)
Eosinophils Relative: 0 %
HCT: 34 % — ABNORMAL LOW (ref 36.0–46.0)
Hemoglobin: 11 g/dL — ABNORMAL LOW (ref 12.0–15.0)
Immature Granulocytes: 1 %
Lymphocytes Relative: 8 %
Lymphs Abs: 1.1 10*3/uL (ref 0.7–4.0)
MCH: 29.5 pg (ref 26.0–34.0)
MCHC: 32.4 g/dL (ref 30.0–36.0)
MCV: 91.2 fL (ref 80.0–100.0)
Monocytes Absolute: 0.8 10*3/uL (ref 0.1–1.0)
Monocytes Relative: 6 %
Neutro Abs: 11.1 10*3/uL — ABNORMAL HIGH (ref 1.7–7.7)
Neutrophils Relative %: 85 %
Platelets: 194 10*3/uL (ref 150–400)
RBC: 3.73 MIL/uL — ABNORMAL LOW (ref 3.87–5.11)
RDW: 13.4 % (ref 11.5–15.5)
WBC: 13 10*3/uL — ABNORMAL HIGH (ref 4.0–10.5)
nRBC: 0 % (ref 0.0–0.2)

## 2020-05-07 LAB — CA 125: Cancer Antigen (CA) 125: 20.2 U/mL (ref 0.0–38.1)

## 2020-05-07 MED ORDER — CHEWING GUM (ORBIT) SUGAR FREE
1.0000 | CHEWING_GUM | ORAL | Status: DC | PRN
  Filled 2020-05-07: qty 1

## 2020-05-07 MED ORDER — SODIUM CHLORIDE 0.9 % IV SOLN: INTRAVENOUS | Status: DC

## 2020-05-07 MED ORDER — RISAQUAD PO CAPS
2.0000 | ORAL_CAPSULE | Freq: Every day | ORAL | Status: DC
  Administered 2020-05-07 – 2020-05-11 (×5): 2 via ORAL
  Filled 2020-05-07 (×5): qty 2

## 2020-05-07 NOTE — Progress Notes (Signed)
HD#2 just over 24 hours of IV antibiotics Rocephin/doxycycline IV, flagyl po  Presumed left tubo ovarian infection     Subjective: Patient reports feels a bit better  No emesis overnight, some nausea.    Objective: I have reviewed patient's vital signs, intake and output, medications, labs and radiology results.  General: alert, cooperative and no distress GI: soft, non-tender; bowel sounds normal; no masses,  no organomegaly Extremities: extremities normal, atraumatic, no cyanosis or edema No rebound pain L>R tenderness is mild, crampy diffuse CBC Latest Ref Rng & Units 05/07/2020 05/06/2020 05/05/2020  WBC 4.0 - 10.5 K/uL 13.0(H) 16.5(H) 17.9(H)  Hemoglobin 12.0 - 15.0 g/dL 11.0(L) 11.8(L) 13.5  Hematocrit 36.0 - 46.0 % 34.0(L) 34.5(L) 40.5  Platelets 150 - 400 K/uL 194 220 273   CA 125 20  CT ABDOMEN PELVIS WO CONTRAST  Addendum Date: 05/06/2020   ADDENDUM REPORT: 05/06/2020 04:02 ADDENDUM: Diverticulosis but no definite acute diverticulitis. These results were called by telephone at the time of interpretation on 05/06/2020 at 4:01 am to provider Zadie Rhine , who verbally acknowledged these results. Electronically Signed   By: Tish Frederickson M.D.   On: 05/06/2020 04:02   Result Date: 05/06/2020 CLINICAL DATA:  Diverticulitis is suspected.  Abdominal pain. EXAM: CT ABDOMEN AND PELVIS WITHOUT CONTRAST TECHNIQUE: Multidetector CT imaging of the abdomen and pelvis was performed following the standard protocol without IV contrast. COMPARISON:  None. FINDINGS: Lower chest: Bibasilar atelectasis.  No acute abnormality. Hepatobiliary: No focal liver abnormality. No gallstones, gallbladder wall thickening, or pericholecystic fluid. No biliary dilatation. Pancreas: No focal lesion. Normal pancreatic contour. No surrounding inflammatory changes. No main pancreatic ductal dilatation. Spleen: Normal in size without focal abnormality. Adrenals/Urinary Tract: No adrenal nodule bilaterally. No  nephrolithiasis, no hydronephrosis, and no contour-deforming renal mass. No ureterolithiasis or hydroureter. The urinary bladder is unremarkable. Stomach/Bowel: Stomach is within normal limits. No evidence of small bowel wall thickening or dilatation. Diffuse colonic diverticulosis. Appendix appears normal. Vascular/Lymphatic: No abdominal aorta or iliac aneurysm. No atherosclerotic plaque of the aorta and its branches. No abdominal, pelvic, or inguinal lymphadenopathy. Reproductive: The uterus and right adnexa are unremarkable. There is a heterogeneous 4.4 x 3 cm left adnexal lesion. Associated fat stranding of the surrounding mesentery is noted. Other: No intraperitoneal free fluid. No intraperitoneal free gas. No organized fluid collection. Musculoskeletal: No abdominal wall hernia or abnormality. No suspicious lytic or blastic osseous lesions. No acute displaced fracture. IMPRESSION: Heterogeneous 4.4 x 3 cm left adnexal lesion. Ovarian torsion not excluded. Recommend pelvic ultrasound for further evaluation. Electronically Signed: By: Tish Frederickson M.D. On: 05/06/2020 02:23   MR PELVIS W WO CONTRAST  Result Date: 05/06/2020 CLINICAL DATA:  Left lower quadrant abdominal pain. Evaluate for PID versus diverticulitis. EXAM: MRI PELVIS WITHOUT AND WITH CONTRAST TECHNIQUE: Multiplanar multisequence MR imaging of the pelvis was performed both before and after administration of intravenous contrast. CONTRAST:  23mL GADAVIST GADOBUTROL 1 MMOL/ML IV SOLN COMPARISON:  Abdominopelvic CT and pelvic ultrasound 05/06/2020. FINDINGS: Urinary Tract: The visualized distal ureters and bladder appear unremarkable. Bowel: Increased mild diffuse small bowel distension within the lower abdomen and false pelvis compared with the earlier CT, most consistent with an ileus. Diffuse diverticular changes are again noted throughout the descending and sigmoid colon. There is no colonic wall thickening, distention or suspicious  enhancement to suggest diverticulitis. Vascular/Lymphatic: No enlarged pelvic lymph nodes identified. No significant vascular findings. The pelvic veins appear patent. Reproductive: Several nabothian cysts are noted. The uterus otherwise appears  normal. As noted on previous studies, there is a complex left ovarian mass which measures 4.2 x 2.6 x 5.2 cm. This demonstrates heterogeneous T2 signal with a probable fluid-fluid level. Following contrast, there is heterogeneous wall and septal enhancement within this adnexal mass, and mild nodularity is present inferiorly. There are diffuse inflammatory changes and ill-defined fluid within the surrounding fat. There is no drainable fluid collection. The right ovary appears unremarkable. Other: Physiologic amount of free pelvic ascites. No focal fluid collection or free air. Musculoskeletal: No acute or worrisome osseous findings. No abnormal osseous or articular enhancement identified. IMPRESSION: 1. Complex left ovarian mass with surrounding inflammatory changes and ill-defined fluid. Findings remain nonspecific, but in conjunction with the patient's symptoms, leukocytosis and prior imaging, findings are most consistent with pelvic inflammatory disease/tubo-ovarian abscess. Less likely considerations would include a complex/hemorrhagic ovarian cyst or neoplasm with intermittent torsion accounting for the surrounding inflammatory changes. Follow-up pelvic ultrasound following appropriate therapy recommended. 2. Increased mild diffuse small bowel distension within the lower abdomen and false pelvis, most consistent with an ileus. 3. Diffuse diverticular changes throughout the descending and sigmoid colon. No evidence of diverticulitis. Electronically Signed   By: Carey Bullocks M.D.   On: 05/06/2020 08:12   US PELVIS (TRANSABDOMINAL ONLY)  Addendum Date: 05/06/2020   ADDENDUM REPORT: 05/06/2020 04:01 ADDENDUM: Unclear why the ovaries are asymmetric. Poor evaluation of  the ovaries on the transabdominal ultrasound. These results were called by telephone at the time of interpretation on 05/06/2020 at 4:00 am to provider Zadie Rhine , who verbally acknowledged these results. Electronically Signed   By: Tish Frederickson M.D.   On: 05/06/2020 04:01   Result Date: 05/06/2020 CLINICAL DATA:  Asymmetric ovaries and inflammatory changes in the pelvis, ovarian torsion not excluded. EXAM: TRANSABDOMINAL ULTRASOUND OF PELVIS DOPPLER ULTRASOUND OF OVARIES TECHNIQUE: Transabdominal ultrasound examination of the pelvis was performed including evaluation of the uterus, ovaries, adnexal regions, and pelvic cul-de-sac. Color and duplex Doppler ultrasound was utilized to evaluate blood flow to the ovaries. COMPARISON:  CT abdomen pelvis 05/06/2020 FINDINGS: Uterus Measurements: 9.4 x 2.6 x 4.9 cm = volume: 87 mL. No fibroids or other mass visualized. Endometrium Thickness: 5 mm.  No focal abnormality visualized. Right ovary Measurements: 2.5 x 0.8 x 1.6 cm = volume: 1.7 mL. Grossly unremarkable appearance adnexal mass. Limited evaluation on transabdominal only ultrasound. Left ovary Measurements: 3.5 x 2.1 x 2.9 cm = volume: 10.8 mL. Grossly unremarkable appearance adnexal mass. Limited evaluation on transabdominal only ultrasound. Pulsed Doppler evaluation demonstrates normal low-resistance arterial and venous waveforms in both ovaries. Other: No free pelvic fluid. IMPRESSION: Asymmetrically enlarged left ovary compared to the right with limited evaluation on this transabdominal pelvic ultrasound. Vascular flow noted to bilateral ovaries with ovarian torsion not excluded as it can be intermittent. Electronically Signed: By: Tish Frederickson M.D. On: 05/06/2020 03:39   US PELVIC DOPPLER (TORSION R/O OR MASS ARTERIAL FLOW)  Addendum Date: 05/06/2020   ADDENDUM REPORT: 05/06/2020 04:01 ADDENDUM: Unclear why the ovaries are asymmetric. Poor evaluation of the ovaries on the transabdominal  ultrasound. These results were called by telephone at the time of interpretation on 05/06/2020 at 4:00 am to provider Zadie Rhine , who verbally acknowledged these results. Electronically Signed   By: Tish Frederickson M.D.   On: 05/06/2020 04:01   Result Date: 05/06/2020 CLINICAL DATA:  Asymmetric ovaries and inflammatory changes in the pelvis, ovarian torsion not excluded. EXAM: TRANSABDOMINAL ULTRASOUND OF PELVIS DOPPLER ULTRASOUND OF OVARIES TECHNIQUE: Transabdominal ultrasound  examination of the pelvis was performed including evaluation of the uterus, ovaries, adnexal regions, and pelvic cul-de-sac. Color and duplex Doppler ultrasound was utilized to evaluate blood flow to the ovaries. COMPARISON:  CT abdomen pelvis 05/06/2020 FINDINGS: Uterus Measurements: 9.4 x 2.6 x 4.9 cm = volume: 87 mL. No fibroids or other mass visualized. Endometrium Thickness: 5 mm.  No focal abnormality visualized. Right ovary Measurements: 2.5 x 0.8 x 1.6 cm = volume: 1.7 mL. Grossly unremarkable appearance adnexal mass. Limited evaluation on transabdominal only ultrasound. Left ovary Measurements: 3.5 x 2.1 x 2.9 cm = volume: 10.8 mL. Grossly unremarkable appearance adnexal mass. Limited evaluation on transabdominal only ultrasound. Pulsed Doppler evaluation demonstrates normal low-resistance arterial and venous waveforms in both ovaries. Other: No free pelvic fluid. IMPRESSION: Asymmetrically enlarged left ovary compared to the right with limited evaluation on this transabdominal pelvic ultrasound. Vascular flow noted to bilateral ovaries with ovarian torsion not excluded as it can be intermittent. Electronically Signed: By: Tish Frederickson M.D. On: 05/06/2020 03:39   Assessment/Plan:  Left adnexal infection, on rocephin/doxy and flagyl with ileus Responding with improving WBC and improved clinical condition  If does not respond I would tilt toward laparoscopic LSO instead of IR drainage given the relative small size and  protracted course with IR  Seems to be responding however  Continue antibiotics    LOS: 1 day    Lazaro Arms 05/07/2020, 7:36 AM

## 2020-05-08 DIAGNOSIS — N7093 Salpingitis and oophoritis, unspecified: Secondary | ICD-10-CM

## 2020-05-08 LAB — CBC
HCT: 31.3 % — ABNORMAL LOW (ref 36.0–46.0)
Hemoglobin: 10.6 g/dL — ABNORMAL LOW (ref 12.0–15.0)
MCH: 30.5 pg (ref 26.0–34.0)
MCHC: 33.9 g/dL (ref 30.0–36.0)
MCV: 89.9 fL (ref 80.0–100.0)
Platelets: 203 10*3/uL (ref 150–400)
RBC: 3.48 MIL/uL — ABNORMAL LOW (ref 3.87–5.11)
RDW: 13.4 % (ref 11.5–15.5)
WBC: 9.8 10*3/uL (ref 4.0–10.5)
nRBC: 0 % (ref 0.0–0.2)

## 2020-05-08 NOTE — Plan of Care (Signed)

## 2020-05-08 NOTE — Progress Notes (Signed)
Subjective: Patient reports no nausea or vomiting Has an apetite Voiding without difficulty.    Objective: I have reviewed patient's vital signs, intake and output, medications, labs and radiology results.  General: alert, cooperative and no distress GI: soft minimal tenderness much less than yesterday, much improved no rebound or guarding  CBC Latest Ref Rng & Units 05/08/2020 05/07/2020 05/06/2020  WBC 4.0 - 10.5 K/uL 9.8 13.0(H) 16.5(H)  Hemoglobin 12.0 - 15.0 g/dL 10.6(L) 11.0(L) 11.8(L)  Hematocrit 36.0 - 46.0 % 31.3(L) 34.0(L) 34.5(L)  Platelets 150 - 400 K/uL 203 194 220    Left tubo ovarian infectious process, may not technically rise to level of abscess based on imaging  Clinically much improved, temperature elevation last night but WBC essentailly half of admission and exam improved  Continue antibiotics, rocephin, doxycycline, flagyl, anticipate switching to augmentin/doxy tomorrow or next day if afebrile today  Allquestions were answered   LOS: 2 days    Anna Stephenson 05/08/2020, 7:43 AM

## 2020-05-09 ENCOUNTER — Encounter (HOSPITAL_COMMUNITY): Payer: Self-pay | Admitting: Family Medicine

## 2020-05-09 LAB — RESP PANEL BY RT-PCR (FLU A&B, COVID) ARPGX2
Influenza A by PCR: NEGATIVE
Influenza B by PCR: NEGATIVE
SARS Coronavirus 2 by RT PCR: NEGATIVE

## 2020-05-09 MED ORDER — AMOXICILLIN-POT CLAVULANATE 875-125 MG PO TABS
1.0000 | ORAL_TABLET | Freq: Two times a day (BID) | ORAL | Status: DC
  Administered 2020-05-09 – 2020-05-11 (×4): 1 via ORAL
  Filled 2020-05-09 (×4): qty 1

## 2020-05-09 MED ORDER — DOXYCYCLINE HYCLATE 100 MG PO TABS
100.0000 mg | ORAL_TABLET | Freq: Two times a day (BID) | ORAL | Status: DC
  Administered 2020-05-09 – 2020-05-11 (×4): 100 mg via ORAL
  Filled 2020-05-09 (×4): qty 1

## 2020-05-09 NOTE — Progress Notes (Signed)
HD#4  Subjective: Patient reports no nausea or vomiting Has an apetite Voiding without difficulty.   Normal BMs now, couple a day  Objective: I have reviewed patient's vital signs, intake and output, medications, labs and radiology results.  General: alert, cooperative and no distress GI: soft minimal tenderness much less than yesterday, much improved no rebound or guarding  CBC Latest Ref Rng & Units 05/08/2020 05/07/2020 05/06/2020  WBC 4.0 - 10.5 K/uL 9.8 13.0(H) 16.5(H)  Hemoglobin 12.0 - 15.0 g/dL 10.6(L) 11.0(L) 11.8(L)  Hematocrit 36.0 - 46.0 % 31.3(L) 34.0(L) 34.5(L)  Platelets 150 - 400 K/uL 203 194 220    Left tubo ovarian infectious process, may not technically rise to level of abscess based on imaging  Clinically much improved, temperature elevation 36 hours ago but WBC essentailly half of admission and exam improved  Continue antibiotics, rocephin, doxycycline, flagyl, anticipate switching to augmentin/doxy tomorrow  if afebrile today  She does have a slight cough, tickle cough, so will retest COVID this am  Allquestions were answered     Lazaro Arms, MD 05/09/2020 7:38 AM

## 2020-05-10 LAB — CBC WITH DIFFERENTIAL/PLATELET
Abs Immature Granulocytes: 0.07 10*3/uL (ref 0.00–0.07)
Basophils Absolute: 0 10*3/uL (ref 0.0–0.1)
Basophils Relative: 0 %
Eosinophils Absolute: 0.2 10*3/uL (ref 0.0–0.5)
Eosinophils Relative: 3 %
HCT: 32.3 % — ABNORMAL LOW (ref 36.0–46.0)
Hemoglobin: 11 g/dL — ABNORMAL LOW (ref 12.0–15.0)
Immature Granulocytes: 1 %
Lymphocytes Relative: 24 %
Lymphs Abs: 1.9 10*3/uL (ref 0.7–4.0)
MCH: 30.1 pg (ref 26.0–34.0)
MCHC: 34.1 g/dL (ref 30.0–36.0)
MCV: 88.5 fL (ref 80.0–100.0)
Monocytes Absolute: 0.8 10*3/uL (ref 0.1–1.0)
Monocytes Relative: 10 %
Neutro Abs: 4.9 10*3/uL (ref 1.7–7.7)
Neutrophils Relative %: 62 %
Platelets: ADEQUATE 10*3/uL (ref 150–400)
RBC: 3.65 MIL/uL — ABNORMAL LOW (ref 3.87–5.11)
RDW: 13.5 % (ref 11.5–15.5)
WBC: 7.9 10*3/uL (ref 4.0–10.5)
nRBC: 0 % (ref 0.0–0.2)

## 2020-05-10 MED ORDER — PANTOPRAZOLE SODIUM 40 MG PO TBEC
40.0000 mg | DELAYED_RELEASE_TABLET | Freq: Every day | ORAL | Status: DC
  Administered 2020-05-10 – 2020-05-11 (×2): 40 mg via ORAL
  Filled 2020-05-10 (×2): qty 1

## 2020-05-10 NOTE — Progress Notes (Signed)
  HD#5 Left pelvic infection/phlegmon/abscess S/P $ days of IV antibiotics now on Augmentin/Doxycycline po   Subjective:    Patient reports some mild pain, much imporved BMs much more formed No nausea or emesis Tolerating diet Tolerated oral antibiotics last night.    Objective: I have reviewed patient's vital signs, intake and output, medications, labs and radiology results.  General: alert, cooperative and no distress GI: soft, non-tender; bowel sounds normal; no masses,  no organomegaly   Assessment/Plan: Left pelvic infection:  Now on orals, Augmentin/Doxycycline WBC pending at this moment Anticipate discharge tomorrow if remains afebrile on orals    LOS: 4 days    Lazaro Arms 05/10/2020, 7:25 AM

## 2020-05-11 ENCOUNTER — Other Ambulatory Visit: Payer: Self-pay | Admitting: Obstetrics and Gynecology

## 2020-05-11 LAB — CULTURE, BLOOD (ROUTINE X 2)
Culture: NO GROWTH
Culture: NO GROWTH
Special Requests: ADEQUATE
Special Requests: ADEQUATE

## 2020-05-11 MED ORDER — ONDANSETRON 4 MG PO TBDP: 4.0000 mg | ORAL_TABLET | Freq: Four times a day (QID) | ORAL | 0 refills | Status: AC | PRN

## 2020-05-11 MED ORDER — DOXYCYCLINE HYCLATE 100 MG PO TABS: 100.0000 mg | ORAL_TABLET | Freq: Two times a day (BID) | ORAL | 0 refills | Status: AC

## 2020-05-11 MED ORDER — AMOXICILLIN-POT CLAVULANATE 875-125 MG PO TABS: 1.0000 | ORAL_TABLET | Freq: Two times a day (BID) | ORAL | 0 refills | Status: AC

## 2020-05-11 MED FILL — AMOX-CLAV 875-125 MG TABLET: 875-125 | 14 days supply | Qty: 28 | Fill #0

## 2020-05-11 MED FILL — DOXYCYCLINE HYCLATE 100 MG: 100 | 14 days supply | Qty: 28 | Fill #0

## 2020-05-11 MED FILL — ONDANSETRON ODT 4 MG TABLET: 4 | 2 days supply | Qty: 6 | Fill #0

## 2020-05-11 NOTE — Progress Notes (Signed)
Ambulated out teaching complete  

## 2020-05-11 NOTE — Progress Notes (Addendum)
   05/11/20 0305  Vital Signs  BP (!) 94/57  Pulse Rate 61  Resp 17  Temp 98.8 F (37.1 C)  Temp Source Oral  Oxygen Therapy  SpO2 92 %  Pt called out c/o of mid lower abdominal pain when she got up to the BR, also c/o of nausea and "feel like passing out " in the BR, assisted pt to bed. VS as above, pt states she feels better after getting into bed, pain is mild and nausea is gone. Resting at this time.  Will continue to assess.

## 2020-05-11 NOTE — Discharge Summary (Signed)
Physician Discharge Summary  Patient ID: Anna Stephenson MRN: [id number] DOB/AGE: May 29, 1965 55 y.o.  Admit date: 05/05/2020 Discharge date: 05/11/2020  Admission Diagnoses: left lower quadrant pain, pelvic infection  Discharge Diagnoses:  Active Problems:   Ovarian mass   Pelvic infection in female   Discharged Condition: good  Hospital Course: The patient was admitted with LLQ pain and febrile illness with presumed diverticulitis.  WBC was elevated as well to 17.9.  CT and MRI suggested an infectious source.  Pt was started on doxycycline, flagyl and rocephin.  Day 2 pt continued improvement.  Pt remained afebrile and was switched to po augmentin and po doxycycline.  HD 6 pt continued to do well.  She was tolerating regular diet and having bowel movement and voids.  The patient did have a flare of pain the previous evening, but that resolved after 20-30 minutes.  Pt was informed a urine culture would be ordered for further evaluation, but she was still discharged on 05/11/20 in good condition.  Consults: None  Significant Diagnostic Studies: radiology: MRI: complex left ovarian mass and CT scan: complex ovarian mass   Treatments: IV hydration and antibiotics: ceftriaxone, metronidazole and doxycycline  Discharge Exam: Blood pressure (!) 102/50, pulse 80, temperature 98.4 F (36.9 C), temperature source Oral, resp. rate 18, height 5' (1.524 m), weight 102.9 kg, last menstrual period 04/21/2020, SpO2 94 %. General appearance: alert, cooperative and no distress Head: Normocephalic, without obvious abnormality, atraumatic Resp: clear to auscultation bilaterally Cardio: regular rate and rhythm GI: soft, nondistended, mildly tender in LLQ Extremities: extremities normal, atraumatic, no cyanosis or edema and Homans sign is negative, no sign of DVT  Disposition: Discharge disposition: 01-Home or Self Care       Discharge Instructions    Call MD for:  persistant nausea and  vomiting   Complete by: As directed    Call MD for:  severe uncontrolled pain   Complete by: As directed    Call MD for:  temperature >100.4   Complete by: As directed    Diet general   Complete by: As directed    Increase activity slowly   Complete by: As directed    Lifting restrictions   Complete by: As directed    No lifting more than 15 pounds     Allergies as of 05/11/2020      Reactions   Shellfish Allergy Anaphylaxis   Nsaids Swelling      Medication List    STOP taking these medications   ciprofloxacin 500 MG tablet Commonly known as: CIPRO   metroNIDAZOLE 500 MG tablet Commonly known as: FLAGYL     TAKE these medications   amoxicillin-clavulanate 875-125 MG tablet Commonly known as: AUGMENTIN Take 1 tablet by mouth every 12 (twelve) hours for 14 days.   ascorbic acid 1000 MG tablet Commonly known as: VITAMIN C Take 1,000-3,000 mg by mouth daily.   CALCIUM PO Take 500 mg by mouth daily.   Cholecalciferol 125 MCG (5000 UT) capsule Take 10,000 Units by mouth daily.   doxycycline 100 MG tablet Commonly known as: VIBRA-TABS Take 1 tablet (100 mg total) by mouth every 12 (twelve) hours for 14 days.   Fiber Powd Take 1 Dose by mouth daily.   Flax Seed Oil 1000 MG Caps Take 1 capsule by mouth daily.   Misc Intestinal Flora Regulat Caps Take 1 capsule by mouth daily.   multivitamin with minerals tablet Take 1 tablet by mouth daily.   Omega-3 1000 MG Caps  Take 1,000 mg by mouth daily.   ondansetron 4 MG disintegrating tablet Commonly known as: Zofran ODT Take 1 tablet (4 mg total) by mouth every 6 (six) hours as needed for nausea.   OVER THE COUNTER MEDICATION Take 1 capsule by mouth daily. Mega food- blood builder iron   TURMERIC PO Take 1 capsule by mouth daily.   VITAMIN B COMPLEX PO Take 1 tablet by mouth daily.   Zinc 40 MG Tabs Take 40 mg by mouth daily.       Follow-up Information    [facility] Follow up on 05/19/2020.   Why:  follow up for pelvic abscess Contact information: [address] [facility]              Signed: Warden Fillers 05/11/2020, 12:55 PM

## 2020-05-11 NOTE — Discharge Instructions (Signed)

## 2020-05-11 NOTE — Progress Notes (Signed)
Gynecology Progress Note  Admission Date: 05/05/2020 Current Date: 05/11/2020 9:15 AM  Anna Stephenson is a 55 y.o. G0P0000 HD#6 admitted for left pelvic infection/abscess.  History complicated by: Patient Active Problem List   Diagnosis Date Noted  . Ovarian mass 05/06/2020  . Pelvic infection in female 05/06/2020    ROS and patient/family/surgical history, located on admission H&P note dated 05/05/2020, have been reviewed, and there are no changes except as noted below Yesterday/Overnight Events:  The previous evening at around 0300 she had a bout of LLQ pain lasting 20 minutes.  She states she felt somewhat clammy and sweaty during the event.  These symptoms are no longer present and she feels fine this morning.  She is ok with being discharged today and will be in the area until Friday evening.    Subjective:  Pt has remained afebrile and continues to have minimal pain.  She is eating and drinking well and continues to void and have bowel movements.  She is tolerating her po antibiotics.     Objective:   Vitals:   05/10/20 2304 05/11/20 0305 05/11/20 0308 05/11/20 0838  BP: (!) 90/45 (!) 94/57  (!) 102/50  Pulse: 81 61  80  Resp: 18 17  18   Temp: 98.9 F (37.2 C) 98.8 F (37.1 C)  98.4 F (36.9 C)  TempSrc: Oral Oral  Oral  SpO2: 97% 92% 96% 94%  Weight:      Height:        Temp:  [98.3 F (36.8 C)-99.1 F (37.3 C)] 98.4 F (36.9 C) (02/23 0838) Pulse Rate:  [61-86] 80 (02/23 0838) Resp:  [17-18] 18 (02/23 0838) BP: (88-135)/(43-73) 102/50 (02/23 0838) SpO2:  [92 %-97 %] 94 % (02/23 0838) No intake/output data recorded. No intake/output data recorded. No intake or output data in the 24 hours ending 05/11/20 0915   Current Vital Signs 24h Vital Sign Ranges  T 98.4 F (36.9 C) Temp  Avg: 98.7 F (37.1 C)  Min: 98.3 F (36.8 C)  Max: 99.1 F (37.3 C)  BP (!) 102/50 BP  Min: 88/47  Max: 135/73  HR 80 Pulse  Avg: 77  Min: 61  Max: 86  RR 18 Resp  Avg: 17.8   Min: 17  Max: 18  SaO2 94 % Room Air SpO2  Avg: 95.8 %  Min: 92 %  Max: 97 %       24 Hour I/O Current Shift I/O  Time Ins Outs No intake/output data recorded. No intake/output data recorded.   Patient Vitals for the past 12 hrs:  BP Temp Temp src Pulse Resp SpO2  05/11/20 0838 (!) 102/50 98.4 F (36.9 C) Oral 80 18 94 %  05/11/20 0308 -- -- -- -- -- 96 %  05/11/20 0305 (!) 94/57 98.8 F (37.1 C) Oral 61 17 92 %  05/10/20 2304 (!) 90/45 98.9 F (37.2 C) Oral 81 18 97 %  05/10/20 2230 (!) 89/43 99.1 F (37.3 C) Oral 83 17 97 %     Patient Vitals for the past 24 hrs:  BP Temp Temp src Pulse Resp SpO2  05/11/20 0838 (!) 102/50 98.4 F (36.9 C) Oral 80 18 94 %  05/11/20 0308 -- -- -- -- -- 96 %  05/11/20 0305 (!) 94/57 98.8 F (37.1 C) Oral 61 17 92 %  05/10/20 2304 (!) 90/45 98.9 F (37.2 C) Oral 81 18 97 %  05/10/20 2230 (!) 89/43 99.1 F (37.3 C) Oral 83 17  97 %  05/10/20 2013 (!) 94/57 98.9 F (37.2 C) Oral 76 18 95 %  05/10/20 1915 -- -- -- -- -- 97 %  05/10/20 1914 (!) 88/47 98.7 F (37.1 C) Oral 86 18 97 %  05/10/20 1525 109/61 98.3 F (36.8 C) Oral 69 18 96 %  05/10/20 1108 135/73 98.5 F (36.9 C) Oral 80 18 97 %    Physical exam: General appearance: alert, cooperative, appears stated age and no distress Abdomen: soft nondistended, very mild tenderness in LLQ GU: No gross VB Lungs: clear to auscultation bilaterally Heart: regular rate and rhythm Extremities: no calf pain bilaterally Skin: WNL Psych: appropriate Neurologic: Grossly normal  Medications Current Facility-Administered Medications  Medication Dose Route Frequency Provider Last Rate Last Admin  . 0.9 %  sodium chloride infusion   Intravenous Continuous Lazaro Arms, MD 100 mL/hr at 05/09/20 0202 New Bag at 05/09/20 0202  . acetaminophen (TYLENOL) tablet 650 mg  650 mg Oral Q4H PRN Reva Bores, MD   650 mg at 05/10/20 0105  . acidophilus (RISAQUAD) capsule 2 capsule  2 capsule Oral Daily  Alric Seton, MD   2 capsule at 05/10/20 0957  . alum & mag hydroxide-simeth (MAALOX/MYLANTA) 200-200-20 MG/5ML suspension 30 mL  30 mL Oral Q4H PRN Reva Bores, MD      . amoxicillin-clavulanate (AUGMENTIN) 875-125 MG per tablet 1 tablet  1 tablet Oral Q12H Lazaro Arms, MD   1 tablet at 05/10/20 2152  . chewing gum (ORBIT) sugar free  1 Stick Oral PRN Lazaro Arms, MD      . doxycycline (VIBRA-TABS) tablet 100 mg  100 mg Oral Q12H Lazaro Arms, MD   100 mg at 05/10/20 2151  . guaiFENesin (ROBITUSSIN) 100 MG/5ML solution 300 mg  15 mL Oral Q4H PRN Reva Bores, MD      . menthol-cetylpyridinium (CEPACOL) lozenge 3 mg  1 lozenge Oral Q2H PRN Reva Bores, MD      . morphine 2 MG/ML injection 1-2 mg  1-2 mg Intravenous Q3H PRN Reva Bores, MD      . pantoprazole (PROTONIX) EC tablet 40 mg  40 mg Oral Daily Warden Fillers, MD   40 mg at 05/10/20 1234  . prochlorperazine (COMPAZINE) injection 10 mg  10 mg Intravenous Q6H PRN [place] Bing, MD   10 mg at 05/07/20 0353      Labs  Recent Labs  Lab 05/07/20 0529 05/08/20 0433 05/10/20 0556  WBC 13.0* 9.8 7.9  HGB 11.0* 10.6* 11.0*  HCT 34.0* 31.3* 32.3*  PLT 194 203 PLATELET CLUMPS NOTED ON SMEAR, COUNT APPEARS ADEQUATE    Recent Labs  Lab 05/05/20 2041 05/06/20 0909 05/07/20 0529  NA 138 139 135  K 4.4 3.7 3.8  CL 101 105 104  CO2 27 26 22   BUN 8 <5* <5*  CREATININE 0.99 0.76 0.74  CALCIUM 9.2 8.5* 8.0*  PROT 6.8 5.7*  --   BILITOT 0.5 0.8  --   ALKPHOS 62 48  --   ALT 25 21  --   AST 21 17  --   GLUCOSE 126* 106* 88    Radiology n/a  Assessment & Plan:  Left pelvic infection Will order urine culture to check for possible UTI, no new symptoms Dispo: d/c home today, continue po antibiotics x 14 days  Code Status: Full Code  Total time taking care of the patient was 20 minutes, with greater than 50% of  the time spent in face to face interaction with the patient.  Mariel Aloe,  MD Attending Center for [facility])

## 2020-05-12 ENCOUNTER — Emergency Department (HOSPITAL_COMMUNITY): Payer: 59

## 2020-05-12 ENCOUNTER — Ambulatory Visit: Payer: Self-pay | Admitting: *Deleted

## 2020-05-12 ENCOUNTER — Encounter (HOSPITAL_COMMUNITY): Payer: Self-pay | Admitting: Emergency Medicine

## 2020-05-12 ENCOUNTER — Other Ambulatory Visit: Payer: Self-pay

## 2020-05-12 ENCOUNTER — Emergency Department (HOSPITAL_COMMUNITY)
Admission: EM | Admit: 2020-05-12 | Discharge: 2020-05-12 | Disposition: A | Discharge status: Home / Self Care | Payer: 59 | Attending: Emergency Medicine | Admitting: Emergency Medicine

## 2020-05-12 DIAGNOSIS — R509 Fever, unspecified: Secondary | ICD-10-CM | POA: Diagnosis not present

## 2020-05-12 DIAGNOSIS — R0602 Shortness of breath: Secondary | ICD-10-CM | POA: Insufficient documentation

## 2020-05-12 DIAGNOSIS — R059 Cough, unspecified: Secondary | ICD-10-CM | POA: Insufficient documentation

## 2020-05-12 DIAGNOSIS — R0781 Pleurodynia: Secondary | ICD-10-CM | POA: Diagnosis present

## 2020-05-12 LAB — CBC
HCT: 39.8 % (ref 36.0–46.0)
Hemoglobin: 12.7 g/dL (ref 12.0–15.0)
MCH: 28.9 pg (ref 26.0–34.0)
MCHC: 31.9 g/dL (ref 30.0–36.0)
MCV: 90.7 fL (ref 80.0–100.0)
Platelets: 452 10*3/uL — ABNORMAL HIGH (ref 150–400)
RBC: 4.39 MIL/uL (ref 3.87–5.11)
RDW: 13.3 % (ref 11.5–15.5)
WBC: 11.2 10*3/uL — ABNORMAL HIGH (ref 4.0–10.5)
nRBC: 0 % (ref 0.0–0.2)

## 2020-05-12 LAB — URINE CULTURE: Culture: <10000 — AB

## 2020-05-12 LAB — BASIC METABOLIC PANEL
Anion gap: 11 (ref 5–15)
BUN: 11 mg/dL (ref 6–20)
CO2: 27 mmol/L (ref 22–32)
Calcium: 9.3 mg/dL (ref 8.9–10.3)
Chloride: 98 mmol/L (ref 98–111)
Creatinine, Ser: 0.76 mg/dL (ref 0.44–1.00)
GFR, Estimated: >60 mL/min (ref >60)
Glucose, Bld: 140 mg/dL — ABNORMAL HIGH (ref 70–99)
Potassium: 4.3 mmol/L (ref 3.5–5.1)
Sodium: 136 mmol/L (ref 135–145)

## 2020-05-12 LAB — TROPONIN I (HIGH SENSITIVITY)
Troponin I (High Sensitivity): 5 ng/L (ref <18)
Troponin I (High Sensitivity): 4 ng/L (ref <18)

## 2020-05-12 MED ORDER — IOHEXOL 350 MG/ML SOLN
66.0000 mL | Freq: Once | INTRAVENOUS | Status: AC | PRN
  Administered 2020-05-12: 66 mL via INTRAVENOUS

## 2020-05-12 NOTE — ED Triage Notes (Signed)
Pt c/o discomfort that starts in her right chest and radiates down and around to her back along with shortness of breath. Pt states she was discharged yesterday after being admitted for a pelvic infection.

## 2020-05-12 NOTE — Discharge Instructions (Signed)
You have been evaluated for your chest pain and shortness of breath.  Fortunately no evidence of any blood clot in your lungs were found on your CT scan.  You do have some mild right-sided atelectasis which is collapse of your airsacs  Use incentive spirometer as recommended to help provide adequate airflow to your lungs and follow instruction below.  Return if you have any concern.

## 2020-05-12 NOTE — Telephone Encounter (Signed)
Discharged from hospital yesterday with ovary/pelvic infection after about one week. Sent home on oral antibiotics for 14 days. Began has mild discomfort under the right breast last night. Today the pain had begun to radiate towards both sides and downward. Reported MRI showed pocket of pus on left ovary. No fever today, thermometer reads 97 now. No N/V/ today. Last food was 9:30a this morning with several cups of water since. Rates pain around 7 but increasing. Pain increases with deep breathing leaving her feeling slight short of breath. Advised ED for immediate evaluation at this time. Partner will take her now.  Reason for Disposition . Patient sounds very sick or weak to the triager  Answer Assessment - Initial Assessment Questions 1. LOCATION: "Where does it hurt?"      Under the right breast 2. RADIATION: "Does the pain shoot anywhere else?" (e.g., chest, back)     Yes, radiates towards both sides and downward. 3. ONSET: "When did the pain begin?" (e.g., minutes, hours or days ago)     Very sudden mild pain last night, has worsened over the day 4. SUDDEN: "Gradual or sudden onset?"     gradual 5. PATTERN "Does the pain come and go, or is it constant?"    - If constant: "Is it getting better, staying the same, or worsening?"      (Note: Constant means the pain never goes away completely; most serious pain is constant and it progresses)     - If intermittent: "How long does it last?" "Do you have pain now?"     (Note: Intermittent means the pain goes away completely between bouts)     constant 6. SEVERITY: "How bad is the pain?"  (e.g., Scale 1-10; mild, moderate, or severe)   - MILD (1-3): doesn't interfere with normal activities, abdomen soft and not tender to touch    - MODERATE (4-7): interferes with normal activities or awakens from sleep, tender to touch    - SEVERE (8-10): excruciating pain, doubled over, unable to do any normal activities      7 7. RECURRENT SYMPTOM: "Have you  ever had this type of stomach pain before?" If Yes, ask: "When was the last time?" and "What happened that time?"      Not really 8. CAUSE: "What do you think is causing the stomach pain?"     Discharged from hospital yesterday with ovary/pelvic infection 9. RELIEAGGRAVATING FACTORS: "What makes it better or worse?" (e.g., movement, antacids, bowel movement)     none 10. OTHER SYMPTOMS: "Has there been any vomiting, diarrhea, constipation, or urine problems?"       none 11. PREGNANCY: "Is there any chance you are pregnant?" "When was your last menstrual period?"       na  Protocols used: ABDOMINAL PAIN - [facility]

## 2020-05-12 NOTE — ED Provider Notes (Signed)
[facility] EMERGENCY DEPARTMENT Provider Note   CSN: [id number] Arrival date & time: 05/12/20  1631     History Chief Complaint  Patient presents with  . Chest Pain    Anna Stephenson is a 55 y.o. female.  The history is provided by the patient and medical records. No language interpreter was used.  Chest Pain    55 year old female presenting for evaluation of chest pain.  Patient was recently admitted from 05/05/2020 and was discharged on 05/11/2020 for initial complaint of left lower quadrant pain and afebrile illness but subsequently was found to have a complex left ovarian mass on MRI.  She was also treated for a suspected infectious source with Rocephin metronidazole and doxycycline.  She report pain and fever has since improved but this morning she developed pain in his chest.  She described pain as a sharp pleuritic sensation to the right side of her chest worse with breathing, moderate in severity.  She endorsed increased shortness of breath and mild cough.  She reports she was not on any anticoagulant during her hospitalization.  She was concern for potential blood clot in the lungs.  She denies any prior history of PE or DVT.  She denies tobacco use, is a occasional drinker.  She has not been vaccinated for COVID-19 but denies any Covid symptoms.  She does not have any significant cardiac history.  She is out of town and lives in [place].  She is currently staying at a local hotel after being discharged from the hospital yesterday.  History reviewed. No pertinent past medical history.  Patient Active Problem List   Diagnosis Date Noted  . Ovarian mass 05/06/2020  . Pelvic infection in female 05/06/2020    History reviewed. No pertinent surgical history.   OB History    Gravida  0   Para  0   Term  0   Preterm  0   AB  0   Living  0     SAB  0   IAB  0   Ectopic  0   Multiple  0   Live Births  0           Family History   Problem Relation Age of Onset  . Diverticulitis Father     Social History   Tobacco Use  . Smoking status: Never Smoker  . Smokeless tobacco: Never Used  Substance Use Topics  . Alcohol use: Yes  . Drug use: Never    Home Medications Prior to Admission medications   Medication Sig Start Date End Date Taking? Authorizing Provider  amoxicillin-clavulanate (AUGMENTIN) 875-125 MG tablet Take 1 tablet by mouth every 12 (twelve) hours for 14 days. 05/11/20 05/25/20  Warden Fillers, MD  ascorbic acid (VITAMIN C) 1000 MG tablet Take 1,000-3,000 mg by mouth daily.    [provider]  B Complex Vitamins (VITAMIN B COMPLEX PO) Take 1 tablet by mouth daily.    [provider]  CALCIUM PO Take 500 mg by mouth daily.    [provider]  Cholecalciferol 125 MCG (5000 UT) capsule Take 10,000 Units by mouth daily.    [provider]  doxycycline (VIBRA-TABS) 100 MG tablet Take 1 tablet (100 mg total) by mouth every 12 (twelve) hours for 14 days. 05/11/20 05/25/20  Warden Fillers, MD  Fiber POWD Take 1 Dose by mouth daily.    [provider]  Flaxseed, Linseed, (FLAX SEED OIL) 1000 MG CAPS Take 1  capsule by mouth daily.    [provider]  Multiple Vitamins-Minerals (MULTIVITAMIN WITH MINERALS) tablet Take 1 tablet by mouth daily.    [provider]  Omega-3 1000 MG CAPS Take 1,000 mg by mouth daily.    [provider]  ondansetron (ZOFRAN ODT) 4 MG disintegrating tablet Take 1 tablet (4 mg total) by mouth every 6 (six) hours as needed for nausea. 05/11/20   Warden Fillers, MD  OVER THE COUNTER MEDICATION Take 1 capsule by mouth daily. Mega food- blood builder iron    [provider]  Probiotic Product (MISC INTESTINAL FLORA REGULAT) CAPS Take 1 capsule by mouth daily.    [provider]  TURMERIC PO Take 1 capsule by mouth daily.    [provider]  Zinc 40 MG TABS Take 40 mg by mouth daily.    [provider]    Allergies    Shellfish allergy and Nsaids  Review of Systems   Review of Systems  Cardiovascular: Positive for chest pain.  All other systems reviewed and are negative.   Physical Exam Updated Vital Signs BP 118/77 (BP Location: Left Arm)   Pulse 81   Temp 98.6 F (37 C) (Oral)   Resp 18   LMP 04/21/2020   SpO2 98%   Physical Exam Vitals and nursing note reviewed.  Constitutional:      General: She is not in acute distress.    Appearance: She is well-developed and well-nourished. She is obese.  HENT:     Head: Atraumatic.  Eyes:     Conjunctiva/sclera: Conjunctivae normal.  Cardiovascular:     Rate and Rhythm: Normal rate and regular rhythm.     Pulses: Normal pulses.     Heart sounds: Normal heart sounds.  Pulmonary:     Effort: Pulmonary effort is normal.     Breath sounds: Normal breath sounds. No wheezing, rhonchi or rales.  Abdominal:     Palpations: Abdomen is soft.  Musculoskeletal:     Cervical back: Neck supple.     Right lower leg: No edema.     Left lower leg: No edema.  Skin:    Findings: No rash.  Neurological:     Mental Status: She is alert and oriented to person, place, and time.  Psychiatric:        Mood and Affect: Mood and affect and mood normal.     ED Results / Procedures / Treatments   Labs (all labs ordered are listed, but only abnormal results are displayed) Labs Reviewed  BASIC METABOLIC PANEL - Abnormal; Notable for the following components:      Result Value   Glucose, Bld 140 (*)    All other components within normal limits  CBC - Abnormal; Notable for the following components:   WBC 11.2 (*)    Platelets 452 (*)    All other components within normal limits  I-STAT BETA HCG BLOOD, ED ([facility], [facility], [facility] ONLY)  TROPONIN I (HIGH SENSITIVITY)  TROPONIN I (HIGH SENSITIVITY)    EKG EKG Interpretation  Date/Time:  Thursday May 12 2020 17:06:43 EST Ventricular Rate:  91 PR Interval:  132 QRS  Duration: 78 QT Interval:  354 QTC Calculation: 435 R Axis:   65 Text Interpretation: Normal sinus rhythm Cannot rule out Inferior infarct , age undetermined Cannot rule out Anterior infarct , age undetermined Abnormal ECG NSR, S1Q3T3 findings, no STEMI Confirmed by Coralee Pesa [phone]) on 05/12/2020 9:53:56 PM  Radiology DG Chest 2 View  Result Date: 05/12/2020 CLINICAL DATA:  Right-sided chest pain for 1 day. EXAM: CHEST - 2 VIEW COMPARISON:  None. FINDINGS: The heart size and mediastinal contours are within normal limits. Low lung volumes are noted. Band like opacity is seen in the right lung base, consistent with subsegmental atelectasis versus scarring. No evidence of pulmonary consolidation or edema. No evidence of pleural effusion. The visualized skeletal structures are unremarkable. IMPRESSION: Low lung volumes, with right basilar atelectasis versus scarring. Electronically Signed   By: Danae Orleans M.D.   On: 05/12/2020 18:28   CT Angio Chest PE W and/or Wo Contrast  Result Date: 05/12/2020 CLINICAL DATA:  Right chest pain radiating to back, short of breath EXAM: CT ANGIOGRAPHY CHEST WITH CONTRAST TECHNIQUE: Multidetector CT imaging of the chest was performed using the standard protocol during bolus administration of intravenous contrast. Multiplanar CT image reconstructions and MIPs were obtained to evaluate the vascular anatomy. CONTRAST:  27mL OMNIPAQUE IOHEXOL 350 MG/ML SOLN COMPARISON:  05/12/2020 FINDINGS: Cardiovascular: This is a technically adequate evaluation of the pulmonary vasculature. No filling defects or pulmonary emboli. The heart is unremarkable without pericardial effusion. Normal caliber of the thoracic aorta. Mediastinum/Nodes: No enlarged mediastinal, hilar, or axillary lymph nodes. Thyroid gland, trachea, and esophagus demonstrate no significant findings. Lungs/Pleura: Minimal atelectasis at the right lung base. No acute airspace disease, effusion, or pneumothorax.  Central airways are patent. Upper Abdomen: No acute abnormality. Musculoskeletal: No acute or destructive bony lesions. Reconstructed images demonstrate no additional findings. Review of the MIP images confirms the above findings. IMPRESSION: 1. No evidence of pulmonary embolus. 2. Minimal right lower lobe atelectasis.  No acute airspace disease. Electronically Signed   By: Sharlet Salina M.D.   On: 05/12/2020 23:02    Procedures Procedures   Medications Ordered in ED Medications  iohexol (OMNIPAQUE) 350 MG/ML injection 66 mL (66 mLs Intravenous Contrast Given 05/12/20 2251)    ED Course  I have reviewed the triage vital signs and the nursing notes.  Pertinent labs & imaging results that were available during my care of the patient were reviewed by me and considered in my medical decision making (see chart for details).    MDM Rules/Calculators/A&P                          BP (!) 107/59 (BP Location: Right Arm)   Pulse 72   Temp 98.6 F (37 C) (Oral)   Resp 16   LMP 04/21/2020   SpO2 95%   Final Clinical Impression(s) / ED Diagnoses Final diagnoses:  Pleuritic chest pain    Rx / DC Orders ED Discharge Orders    None     9:48 PM Patient was hospitalized 6 days for concerns of an abdominal infection, she was found to have an either an ovarian mass or a pocket of fluid on the left lower abdomen in which she was treated with antibiotic and was discharged home yesterday.  She is here with new onset of pleuritic chest pain and some shortness of breath with concerns for potential PE.  She does not have any significant cardiac history.  Cardiac work-up today is unremarkable, negative delta troponin, chest x-ray shows lower lung volumes with right basilar atelectasis versus scarring.  In the setting of obesity, pleuritic chest pain and shortness of breath, will obtain chest CT angiogram to rule out PE.  Furthermore, her EKG shows an S1 Q3 T3 pattern suggestive of  right heart strain.  It  would be worthwhile to obtain a chest CT angiogram to rule out PE.  CAre discussed with Dr. Wilkie AyeHOrton.   11:08 PM Fortunately chest CTA showed no evidence of PE.  Minimal right lower lobe atelectasis but no acute airspace disease.  At this time patient is stable for discharge.  Outpatient follow-up recommended.  Return precaution given.   Fayrene Helperran, [place], PA-C 05/12/20 2314    Horton, Clabe SealKristie M, DO 05/13/20 0009

## 2021-11-14 ENCOUNTER — Other Ambulatory Visit (HOSPITAL_COMMUNITY): Payer: Self-pay

## 2022-06-13 IMAGING — CT CT ANGIO CHEST
2 of 7 series · 18 of 46 positions shown · IV contrast (omnipaque)
Comparison: 05/12/2020

CLINICAL DATA: Right chest pain radiating to back, short of breath

EXAM:
CT ANGIOGRAPHY CHEST WITH CONTRAST
TECHNIQUE: Multidetector CT imaging of the chest was performed using the
standard protocol during bolus administration of intravenous
contrast. Multiplanar CT image reconstructions and MIPs were
obtained to evaluate the vascular anatomy.
CONTRAST:  66mL OMNIPAQUE IOHEXOL 350 MG/ML SOLN

[Series 6: thins · axial · 0.83mm/px · z∈[+1019,+1222]mm · 15 of 229 slices shown]
[im 13/229  lung]
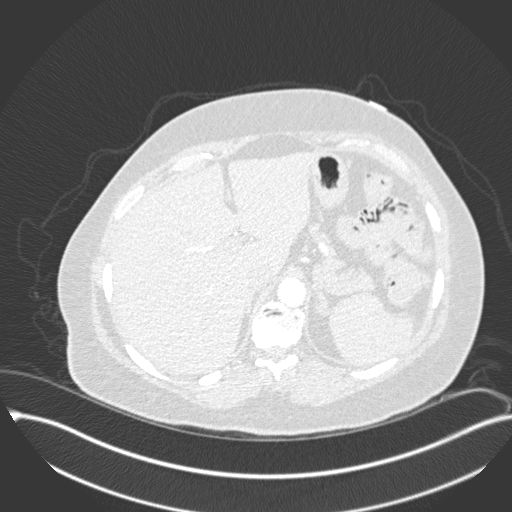
[im 26/229  soft-tissue]
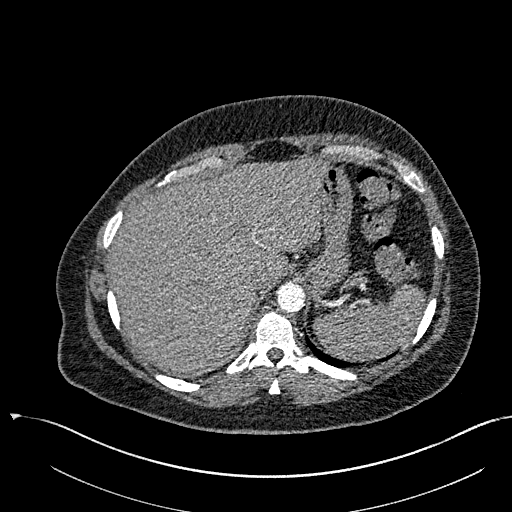
[im 39/229  lung]
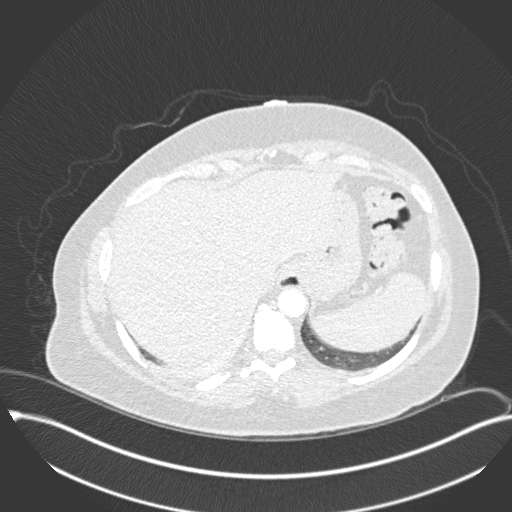
[im 51/229  soft-tissue]
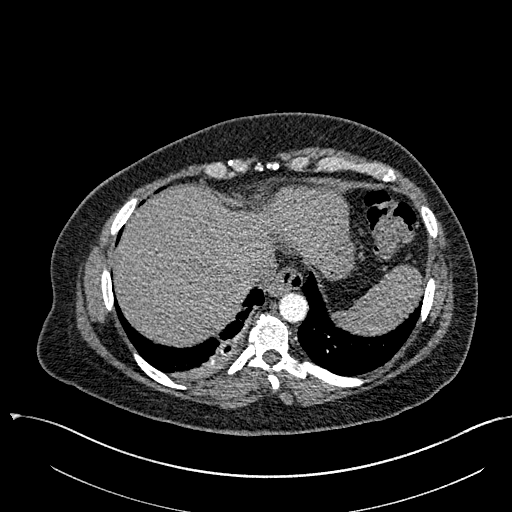
[im 77/229  lung]
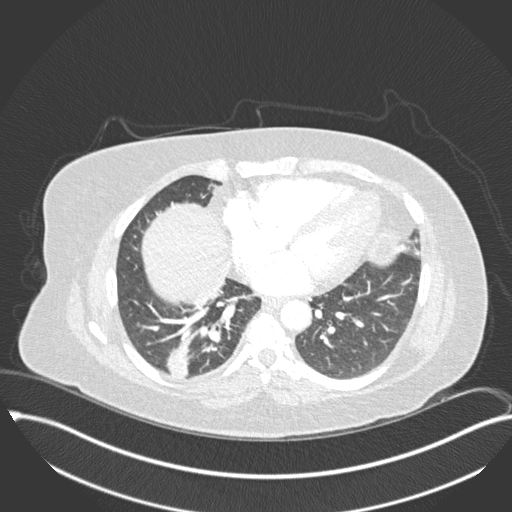
[im 89/229  soft-tissue]
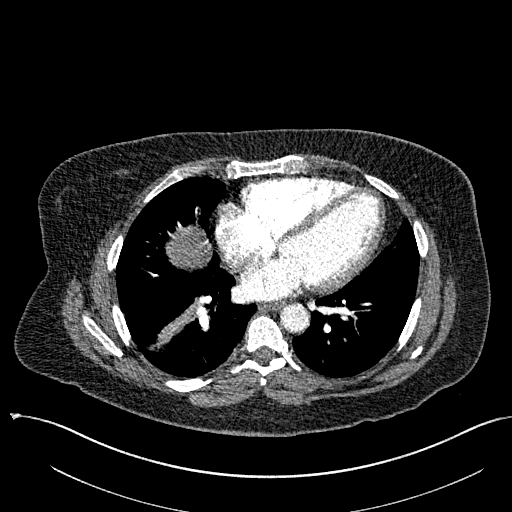
[im 102/229  lung]
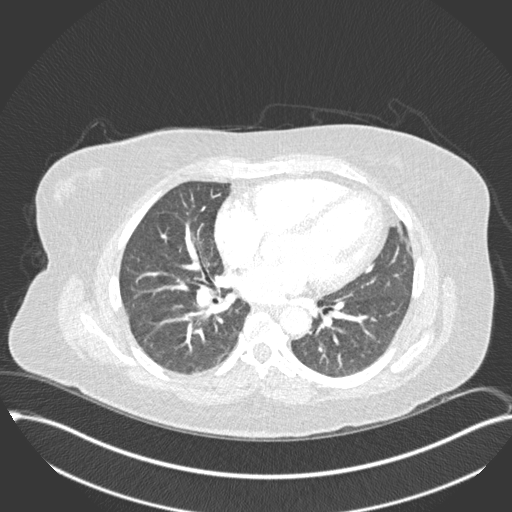
[im 115/229  soft-tissue]
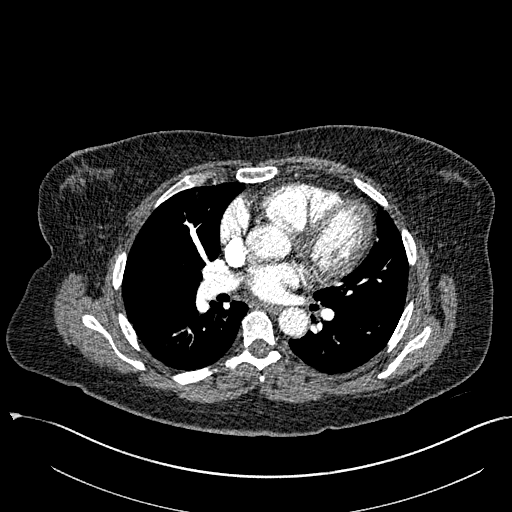
[im 127/229  lung]
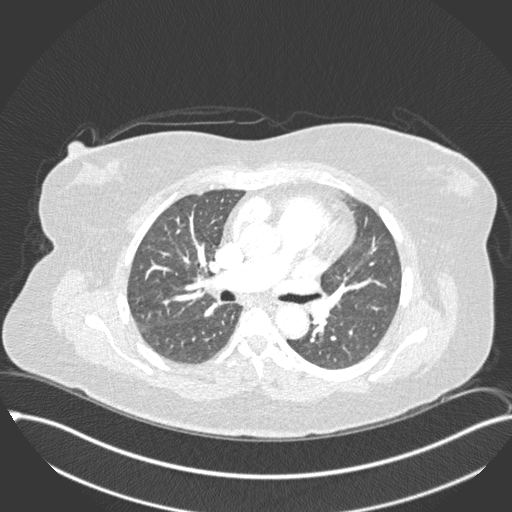
[im 140/229  soft-tissue]
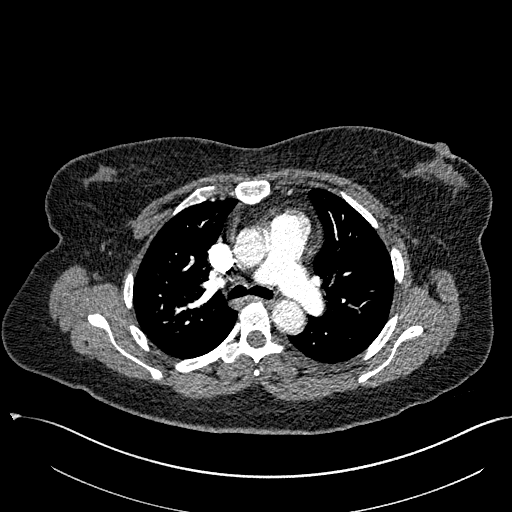
[im 153/229  lung]
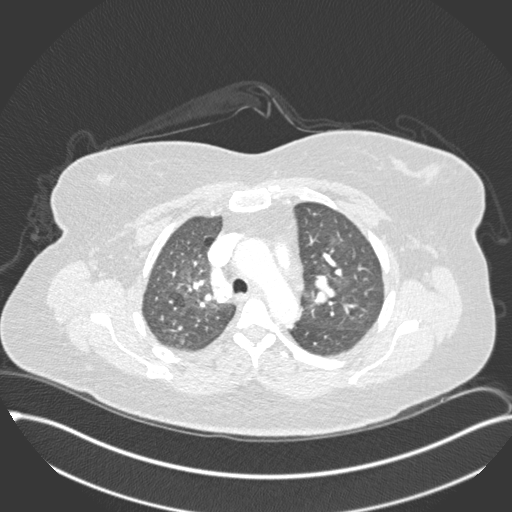
[im 178/229  soft-tissue]
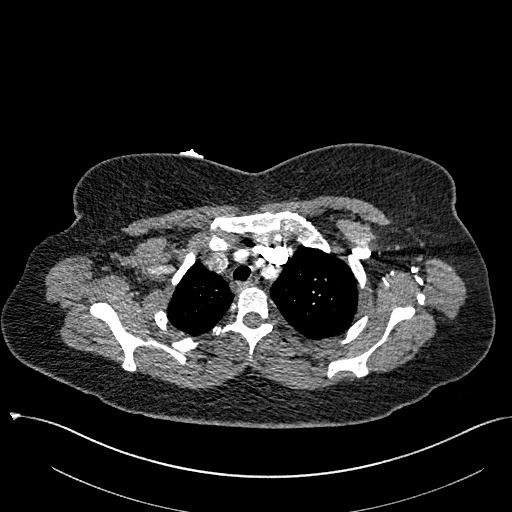
[im 191/229  lung]
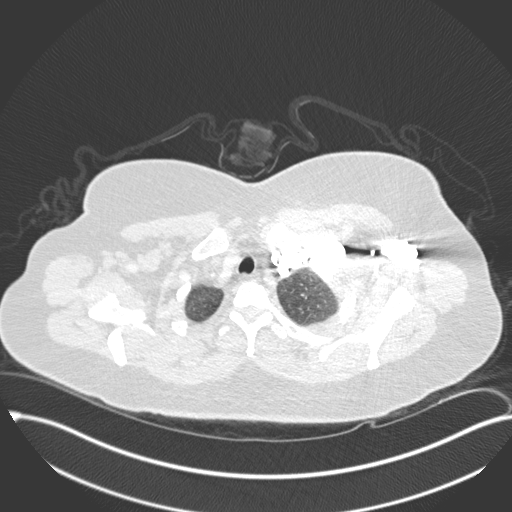
[im 203/229  soft-tissue]
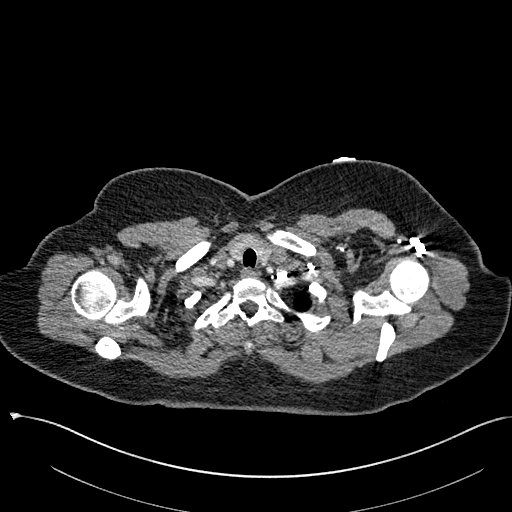
[im 216/229  lung]
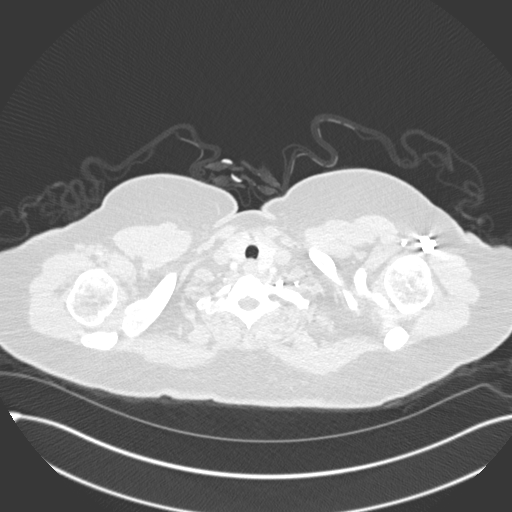

[Series 7: coronal mpr · coronal · 0.48mm/px · 3 of 135 slices shown]
[im 34/135  soft-tissue]
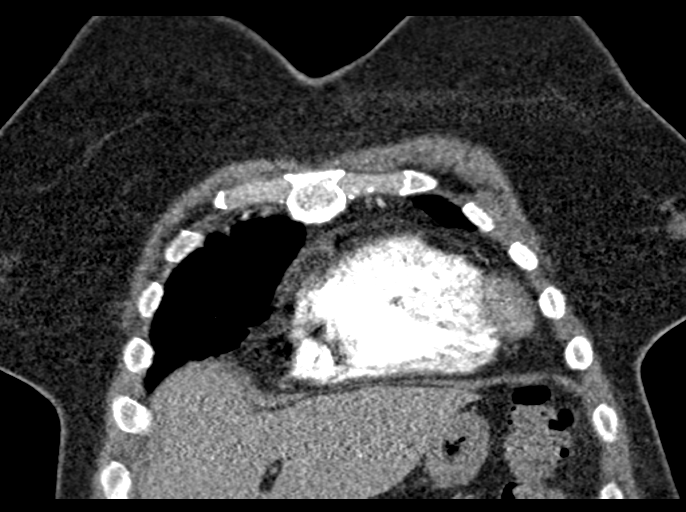
[im 68/135  soft-tissue]
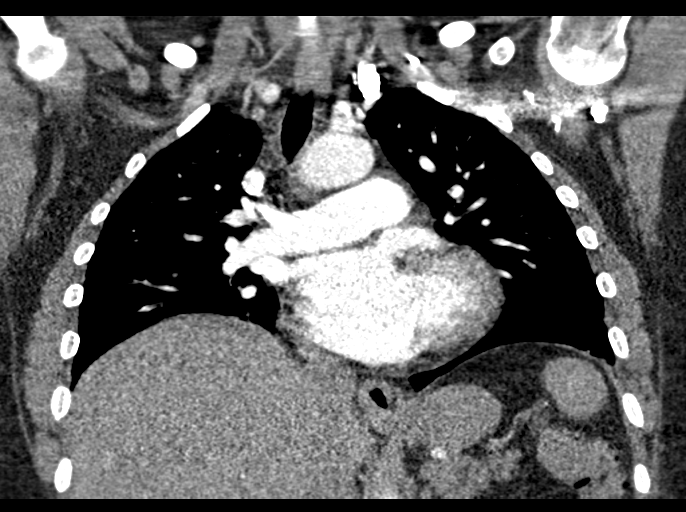
[im 101/135  soft-tissue]
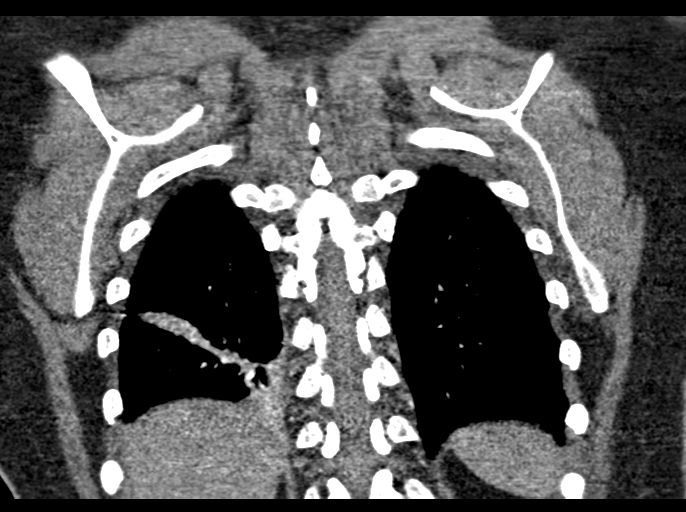

[18 of 46 positions shown; findings below may reference images not displayed]

FINDINGS: Cardiovascular: This is a technically adequate evaluation of the
pulmonary vasculature. No filling defects or pulmonary emboli.

The heart is unremarkable without pericardial effusion. Normal
caliber of the thoracic aorta.

Mediastinum/Nodes: No enlarged mediastinal, hilar, or axillary lymph
nodes. Thyroid gland, trachea, and esophagus demonstrate no
significant findings.

Lungs/Pleura: Minimal atelectasis at the right lung base. No acute
airspace disease, effusion, or pneumothorax. Central airways are
patent.

Upper Abdomen: No acute abnormality.

Musculoskeletal: No acute or destructive bony lesions. Reconstructed
images demonstrate no additional findings.

Review of the MIP images confirms the above findings.
IMPRESSION: 1. No evidence of pulmonary embolus.
2. Minimal right lower lobe atelectasis.  No acute airspace disease.
